# Patient Record
Sex: Female | Born: 1980 | Race: White | Hispanic: No | Marital: Single | Smoking: Former smoker
Health system: Southern US, Community
[De-identification: ages and names within clinical notes are randomized; demographics above are authoritative.]

## PROBLEM LIST (undated history)

## (undated) ENCOUNTER — Inpatient Hospital Stay (HOSPITAL_COMMUNITY): Payer: Self-pay

## (undated) DIAGNOSIS — Z789 Other specified health status: Secondary | ICD-10-CM

## (undated) DIAGNOSIS — F1411 Cocaine abuse, in remission: Secondary | ICD-10-CM

## (undated) HISTORY — PX: NO PAST SURGERIES: SHX2092

---

## 2005-01-09 ENCOUNTER — Emergency Department (HOSPITAL_COMMUNITY): Admission: EM | Admit: 2005-01-09 | Discharge: 2005-01-09 | Payer: Self-pay | Admitting: Emergency Medicine

## 2008-05-06 ENCOUNTER — Emergency Department (HOSPITAL_BASED_OUTPATIENT_CLINIC_OR_DEPARTMENT_OTHER): Admission: EM | Admit: 2008-05-06 | Discharge: 2008-05-06 | Payer: Self-pay | Admitting: Emergency Medicine

## 2008-06-05 ENCOUNTER — Emergency Department (HOSPITAL_BASED_OUTPATIENT_CLINIC_OR_DEPARTMENT_OTHER): Admission: EM | Admit: 2008-06-05 | Discharge: 2008-06-05 | Payer: Self-pay | Admitting: Emergency Medicine

## 2011-03-06 LAB — ANTIBODY SCREEN: Antibody Screen: NEGATIVE

## 2011-03-06 LAB — CBC: Hemoglobin: 13.2 g/dL (ref 12.0–16.0)

## 2011-03-06 LAB — RPR: RPR: NONREACTIVE

## 2011-03-06 LAB — HIV ANTIBODY (ROUTINE TESTING W REFLEX): HIV: NONREACTIVE

## 2011-03-12 LAB — CYTOLOGY - PAP: Pap: NEGATIVE

## 2011-03-12 LAB — GC/CHLAMYDIA PROBE AMP, GENITAL
Chlamydia: NEGATIVE
Gonorrhea: NEGATIVE

## 2011-04-10 LAB — URINE MICROSCOPIC-ADD ON

## 2011-04-10 LAB — URINE CULTURE

## 2011-04-10 LAB — URINALYSIS, ROUTINE W REFLEX MICROSCOPIC
Hgb urine dipstick: NEGATIVE
Ketones, ur: NEGATIVE mg/dL
Protein, ur: NEGATIVE mg/dL
Urobilinogen, UA: 1 mg/dL (ref 0.0–1.0)
pH: 7 (ref 5.0–8.0)

## 2011-05-16 LAB — AFP, QUAD SCREEN: Maternal quad screen for MFM: NEGATIVE

## 2011-07-07 NOTE — L&D Delivery Note (Signed)
Agree with above note.  Joshalyn Ancheta 11/09/2011 2:24 PM   

## 2011-07-07 NOTE — L&D Delivery Note (Signed)
Delivery Note At 10:24 PM a viable female was delivered via Vaginal, Spontaneous Delivery (Presentation: Middle Occiput Anterior).  APGAR: 8, 9; weight .   Placenta status: Intact, Spontaneous.  Cord: 3 vessels with the following complications: None.  Cord pH: NA  Nuchal cord x1. Easily reducible  Anesthesia: Epidural  Episiotomy: None Lacerations: 1st Degree; perineal Suture Repair: 3.0 vicryl Est. Blood Loss (mL):   Mom to postpartum.  Baby to nursery-stable.  Abbye Lao 10/20/2011, 10:57 PM

## 2011-08-10 LAB — CBC: Hemoglobin: 11.4 g/dL — AB (ref 12.0–16.0)

## 2011-08-10 LAB — GLUCOSE, 1 HOUR: Drug Screen, Urine: NEGATIVE

## 2011-09-15 DIAGNOSIS — O99320 Drug use complicating pregnancy, unspecified trimester: Secondary | ICD-10-CM | POA: Insufficient documentation

## 2011-09-15 LAB — DRUG SCREEN, URINE

## 2011-09-17 ENCOUNTER — Ambulatory Visit (INDEPENDENT_AMBULATORY_CARE_PROVIDER_SITE_OTHER): Payer: Medicaid Other | Admitting: Obstetrics & Gynecology

## 2011-09-17 ENCOUNTER — Encounter: Payer: Self-pay | Admitting: Obstetrics & Gynecology

## 2011-09-17 VITALS — BP 111/71 | Temp 98.5°F | Wt 189.9 lb

## 2011-09-17 DIAGNOSIS — F1910 Other psychoactive substance abuse, uncomplicated: Secondary | ICD-10-CM

## 2011-09-17 DIAGNOSIS — F1920 Other psychoactive substance dependence, uncomplicated: Secondary | ICD-10-CM

## 2011-09-17 DIAGNOSIS — O0973 Supervision of high risk pregnancy due to social problems, third trimester: Secondary | ICD-10-CM

## 2011-09-17 DIAGNOSIS — O99340 Other mental disorders complicating pregnancy, unspecified trimester: Secondary | ICD-10-CM

## 2011-09-17 DIAGNOSIS — O99320 Drug use complicating pregnancy, unspecified trimester: Secondary | ICD-10-CM

## 2011-09-17 LAB — POCT URINALYSIS DIP (DEVICE)
Glucose, UA: NEGATIVE mg/dL
Hgb urine dipstick: NEGATIVE
Ketones, ur: NEGATIVE mg/dL
Leukocytes, UA: NEGATIVE
Protein, ur: NEGATIVE mg/dL
pH: 7 (ref 5.0–8.0)

## 2011-09-17 MED ORDER — RANITIDINE HCL 150 MG PO TABS: 150.0000 mg | ORAL_TABLET | Freq: Two times a day (BID) | ORAL | Status: DC

## 2011-09-17 NOTE — Patient Instructions (Signed)
Breastfeeding BENEFITS OF BREASTFEEDING For the baby  The first milk (colostrum) helps the baby's digestive system function better.   There are antibodies from the mother in the milk that help the baby fight off infections.   The baby has a lower incidence of asthma, allergies, and SIDS (sudden infant death syndrome).   The nutrients in breast milk are better than formulas for the baby and helps the baby's brain grow better.   Babies who breastfeed have less gas, colic, and constipation.  For the mother  Breastfeeding helps develop a very special bond between mother and baby.   It is more convenient, always available at the correct temperature and cheaper than formula feeding.   It burns calories in the mother and helps with losing weight that was gained during pregnancy.   It makes the uterus contract back down to normal size faster and slows bleeding following delivery.   Breastfeeding mothers have a lower risk of developing breast cancer.  NURSE FREQUENTLY  A healthy, full-term baby may breastfeed as often as every hour or space his or her feedings to every 3 hours.   How often to nurse will vary from baby to baby. Watch your baby for signs of hunger, not the clock.   Nurse as often as the baby requests, or when you feel the need to reduce the fullness of your breasts.   Awaken the baby if it has been 3 to 4 hours since the last feeding.   Frequent feeding will help the mother make more milk and will prevent problems like sore nipples and engorgement of the breasts.  BABY'S POSITION AT THE BREAST  Whether lying down or sitting, be sure that the baby's tummy is facing your tummy.   Support the breast with 4 fingers underneath the breast and the thumb above. Make sure your fingers are well away from the nipple and baby's mouth.   Stroke the baby's lips and cheek closest to the breast gently with your finger or nipple.   When the baby's mouth is open wide enough, place all  of your nipple and as much of the dark area around the nipple as possible into your baby's mouth.   Pull the baby in close so the tip of the nose and the baby's cheeks touch the breast during the feeding.  FEEDINGS  The length of each feeding varies from baby to baby and from feeding to feeding.   The baby must suck about 2 to 3 minutes for your milk to get to him or her. This is called a "let down." For this reason, allow the baby to feed on each breast as long as he or she wants. Your baby will end the feeding when he or she has received the right balance of nutrients.   To break the suction, put your finger into the corner of the baby's mouth and slide it between his or her gums before removing your breast from his or her mouth. This will help prevent sore nipples.  REDUCING BREAST ENGORGEMENT  In the first week after your baby is born, you may experience signs of breast engorgement. When breasts are engorged, they feel heavy, warm, full, and may be tender to the touch. You can reduce engorgement if you:   Nurse frequently, every 2 to 3 hours. Mothers who breastfeed early and often have fewer problems with engorgement.   Place light ice packs on your breasts between feedings. This reduces swelling. Wrap the ice packs in a   lightweight towel to protect your skin.   Apply moist hot packs to your breast for 5 to 10 minutes before each feeding. This increases circulation and helps the milk flow.   Gently massage your breast before and during the feeding.   Make sure that the baby empties at least one breast at every feeding before switching sides.   Use a breast pump to empty the breasts if your baby is sleepy or not nursing well. You may also want to pump if you are returning to work or or you feel you are getting engorged.   Avoid bottle feeds, pacifiers or supplemental feedings of water or juice in place of breastfeeding.   Be sure the baby is latched on and positioned properly while  breastfeeding.   Prevent fatigue, stress, and anemia.   Wear a supportive bra, avoiding underwire styles.   Eat a balanced diet with enough fluids.  If you follow these suggestions, your engorgement should improve in 24 to 48 hours. If you are still experiencing difficulty, call your lactation consultant or caregiver. IS MY BABY GETTING ENOUGH MILK? Sometimes, mothers worry about whether their babies are getting enough milk. You can be assured that your baby is getting enough milk if:  The baby is actively sucking and you hear swallowing.   The baby nurses at least 8 to 12 times in a 24 hour time period. Nurse your baby until he or she unlatches or falls asleep at the first breast (at least 10 to 20 minutes), then offer the second side.   The baby is wetting 5 to 6 disposable diapers (6 to 8 cloth diapers) in a 24 hour period by 5 to 6 days of age.   The baby is having at least 2 to 3 stools every 24 hours for the first few months. Breast milk is all the food your baby needs. It is not necessary for your baby to have water or formula. In fact, to help your breasts make more milk, it is best not to give your baby supplemental feedings during the early weeks.   The stool should be soft and yellow.   The baby should gain 4 to 7 ounces per week after he is 4 days old.  TAKE CARE OF YOURSELF Take care of your breasts by:  Bathing or showering daily.   Avoiding the use of soaps on your nipples.   Start feedings on your left breast at one feeding and on your right breast at the next feeding.   You will notice an increase in your milk supply 2 to 5 days after delivery. You may feel some discomfort from engorgement, which makes your breasts very firm and often tender. Engorgement "peaks" out within 24 to 48 hours. In the meantime, apply warm moist towels to your breasts for 5 to 10 minutes before feeding. Gentle massage and expression of some milk before feeding will soften your breasts, making  it easier for your baby to latch on. Wear a well fitting nursing bra and air dry your nipples for 10 to 15 minutes after each feeding.   Only use cotton bra pads.   Only use pure lanolin on your nipples after nursing. You do not need to wash it off before nursing.  Take care of yourself by:   Eating well-balanced meals and nutritious snacks.   Drinking milk, fruit juice, and water to satisfy your thirst (about 8 glasses a day).   Getting plenty of rest.   Increasing calcium in   your diet (1200 mg a day).   Avoiding foods that you notice affect the baby in a bad way.  SEEK MEDICAL CARE IF:   You have any questions or difficulty with breastfeeding.   You need help.   You have a hard, red, sore area on your breast, accompanied by a fever of 100.5 F (38.1 C) or more.   Your baby is too sleepy to eat well or is having trouble sleeping.   Your baby is wetting less than 6 diapers per day, by 5 days of age.   Your baby's skin or white part of his or her eyes is more yellow than it was in the hospital.   You feel depressed.  Document Released: 06/22/2005 Document Revised: 06/11/2011 Document Reviewed: 02/04/2009 [employer] Patient Information 2012 [employer]. 

## 2011-09-17 NOTE — Progress Notes (Signed)
U/S 09/18/11 at 1030 am.

## 2011-09-17 NOTE — Progress Notes (Signed)
Pulse 89. Pain upper abdomen, and vagina. Vaginal d/c as thin, and brownish.

## 2011-09-17 NOTE — Progress Notes (Signed)
Nutrition Note:  (1st nutrition consult) Pt seen at [facility] for substance abuse.  Pt does receive WIC services. Pt reports good intake of 6 small meals, severe nausea and vomiting in first 7 months.  Does have heartburn currently. Overall wt gain of 30.9# is excessive at [redacted]w[redacted]d gestation.  Plots 10#> expected. Disc wt gain goals and increased activity.  Patient takes PNV and Zantac.  Reports excessive soda intake and limited water.  Pt is a smoker but plans to stop smoking today.  Follow up if referred.  Cy Blamer, RD

## 2011-09-18 ENCOUNTER — Ambulatory Visit (HOSPITAL_COMMUNITY)
Admission: RE | Admit: 2011-09-18 | Discharge: 2011-09-18 | Disposition: A | Discharge status: Home / Self Care | Payer: Medicaid Other | Source: Ambulatory Visit | Attending: Obstetrics & Gynecology | Admitting: Obstetrics & Gynecology

## 2011-09-18 ENCOUNTER — Encounter: Payer: Self-pay | Admitting: *Deleted

## 2011-09-18 DIAGNOSIS — O99320 Drug use complicating pregnancy, unspecified trimester: Secondary | ICD-10-CM

## 2011-09-18 DIAGNOSIS — F1920 Other psychoactive substance dependence, uncomplicated: Secondary | ICD-10-CM | POA: Insufficient documentation

## 2011-09-18 DIAGNOSIS — F1410 Cocaine abuse, uncomplicated: Secondary | ICD-10-CM | POA: Insufficient documentation

## 2011-09-18 DIAGNOSIS — O0973 Supervision of high risk pregnancy due to social problems, third trimester: Secondary | ICD-10-CM

## 2011-09-18 DIAGNOSIS — O3660X0 Maternal care for excessive fetal growth, unspecified trimester, not applicable or unspecified: Secondary | ICD-10-CM | POA: Insufficient documentation

## 2011-09-18 DIAGNOSIS — O99330 Smoking (tobacco) complicating pregnancy, unspecified trimester: Secondary | ICD-10-CM | POA: Insufficient documentation

## 2011-09-21 ENCOUNTER — Encounter: Payer: Self-pay | Admitting: Obstetrics & Gynecology

## 2011-09-21 DIAGNOSIS — O3660X0 Maternal care for excessive fetal growth, unspecified trimester, not applicable or unspecified: Secondary | ICD-10-CM | POA: Insufficient documentation

## 2011-09-28 ENCOUNTER — Inpatient Hospital Stay (HOSPITAL_COMMUNITY)
Admission: AD | Admit: 2011-09-28 | Discharge: 2011-09-28 | Disposition: A | Discharge status: Home / Self Care | Payer: Medicaid Other | Source: Ambulatory Visit | Attending: Obstetrics & Gynecology | Admitting: Obstetrics & Gynecology

## 2011-09-28 ENCOUNTER — Encounter (HOSPITAL_COMMUNITY): Payer: Self-pay | Admitting: *Deleted

## 2011-09-28 DIAGNOSIS — O212 Late vomiting of pregnancy: Secondary | ICD-10-CM | POA: Insufficient documentation

## 2011-09-28 DIAGNOSIS — R109 Unspecified abdominal pain: Secondary | ICD-10-CM | POA: Insufficient documentation

## 2011-09-28 DIAGNOSIS — O210 Mild hyperemesis gravidarum: Secondary | ICD-10-CM

## 2011-09-28 DIAGNOSIS — D689 Coagulation defect, unspecified: Secondary | ICD-10-CM | POA: Insufficient documentation

## 2011-09-28 DIAGNOSIS — O99119 Other diseases of the blood and blood-forming organs and certain disorders involving the immune mechanism complicating pregnancy, unspecified trimester: Secondary | ICD-10-CM | POA: Insufficient documentation

## 2011-09-28 DIAGNOSIS — D696 Thrombocytopenia, unspecified: Secondary | ICD-10-CM

## 2011-09-28 HISTORY — DX: Other specified health status: Z78.9

## 2011-09-28 LAB — CBC
Platelets: 107 10*3/uL — ABNORMAL LOW (ref 150–400)
RBC: 3.33 MIL/uL — ABNORMAL LOW (ref 3.87–5.11)
RDW: 13.2 % (ref 11.5–15.5)
WBC: 8.4 10*3/uL (ref 4.0–10.5)

## 2011-09-28 LAB — URINALYSIS, ROUTINE W REFLEX MICROSCOPIC
Bilirubin Urine: NEGATIVE
Ketones, ur: NEGATIVE mg/dL
Leukocytes, UA: NEGATIVE
Protein, ur: NEGATIVE mg/dL

## 2011-09-28 LAB — COMPREHENSIVE METABOLIC PANEL
ALT: 8 U/L (ref 0–35)
Albumin: 2.3 g/dL — ABNORMAL LOW (ref 3.5–5.2)
BUN: 8 mg/dL (ref 6–23)
Creatinine, Ser: 0.65 mg/dL (ref 0.50–1.10)
Glucose, Bld: 90 mg/dL (ref 70–99)

## 2011-09-28 LAB — RAPID URINE DRUG SCREEN, HOSP PERFORMED
Amphetamines: POSITIVE — AB
Benzodiazepines: NOT DETECTED
Cocaine: NOT DETECTED
Opiates: NOT DETECTED
Tetrahydrocannabinol: NOT DETECTED

## 2011-09-28 LAB — WET PREP, GENITAL
Clue Cells Wet Prep HPF POC: NONE SEEN
Trich, Wet Prep: NONE SEEN
WBC, Wet Prep HPF POC: NONE SEEN
Yeast Wet Prep HPF POC: NONE SEEN

## 2011-09-28 MED ORDER — ONDANSETRON 8 MG PO TBDP
8.0000 mg | ORAL_TABLET | ORAL | Status: AC
  Administered 2011-09-28: 8 mg via ORAL
  Filled 2011-09-28: qty 1

## 2011-09-28 MED ORDER — ONDANSETRON HCL 8 MG PO TABS: 4.0000 mg | ORAL_TABLET | Freq: Three times a day (TID) | ORAL | Status: AC | PRN

## 2011-09-28 NOTE — MAU Provider Note (Signed)
History     CSN: [id number]  Arrival date and time: 09/28/11 1027   None    Chief Complaint  Patient presents with  . Emesis During Pregnancy  . Abdominal Pain   HPI  [name] [name] is a 31 y.o. G2P1001 who presents at [redacted]w[redacted]d with nausea and vomiting since yesterday. Emesis x7, with three today. Intermittent sharp lower abdominal pain when vomiting. Urge to vomit increases when around light or sound. Able to keep down water, but nothing else. No bleeding or gush of fluid. Mild amount of brownish-yellow discharge for 1 week. Receives prenatal care at the [facility] for substance abuse. Only seen once. Incarcerated from 02/02/11 through 08/03/11. Relapse on 2/27 with crack cocaine, marijuana and xanax use which showed up on her last UDS on 09/07/11. Denies any drug use since this time. First delivery was SVD, weighing 7lbs, 6ozs. Last ultrasound shows weight >90% consistent with LGA. 1 hr glucose was 104 at the health department. Estimated Date of Delivery: 10/28/11 based on  U/S at 19 weeks. EDD 10/23/11 based on LMP.   Past Medical History  Diagnosis Date  . No pertinent past medical history     Past Surgical History  Procedure Date  . No past surgeries     Family History  Problem Relation Age of Onset  . Anesthesia problems Neg Hx     History  Substance Use Topics  . Smoking status: Current Some Day Smoker -- 13 years    Types: Cigarettes  . Smokeless tobacco: Former Neurosurgeon    Quit date: 09/17/2011  . Alcohol Use: Not on file    Allergies:  Allergies  Allergen Reactions  . Risperdal     Prescriptions prior to admission  Medication Sig Dispense Refill  . acetaminophen (TYLENOL) 325 MG tablet Take 325 mg by mouth every 6 (six) hours as needed.      Marland Kitchen PRENATAL VITAMINS PO Take 1 tablet by mouth daily.      . ranitidine (ZANTAC) 150 MG tablet Take 1 tablet (150 mg total) by mouth 2 (two) times daily.  60 tablet  1    Review of Systems  Constitutional: Negative for  fever and diaphoresis.  Eyes: Negative for blurred vision, double vision and photophobia.  Respiratory: Negative for shortness of breath.   Gastrointestinal: Positive for nausea, vomiting and abdominal pain. Negative for diarrhea.  Genitourinary: Negative for hematuria.  Neurological: Negative for headaches.   Physical Exam   Height 5' 6.5" (1.689 m), weight 88.225 kg (194 lb 8 oz), last menstrual period 01/16/2011.  Physical Exam  Constitutional: She is oriented to person, place, and time. She appears well-developed and well-nourished. No distress.  HENT:  Head: Normocephalic and atraumatic.  Eyes: EOM are normal.  Neck: Normal range of motion.  Cardiovascular: Normal rate.   Respiratory: Effort normal. No respiratory distress.  GI: Soft. She exhibits distension (gravid).  Musculoskeletal: Normal range of motion.  Neurological: She is alert and oriented to person, place, and time. No cranial nerve deficit.  Skin: Skin is warm and dry. No rash noted. She is not diaphoretic. No erythema.  Psychiatric: She has a normal mood and affect. Her behavior is normal. Judgment and thought content normal.    MAU Course  Procedures  FHR: 135 baseline, + accels, no decels, moderate variability. Categorgy I No contractions  Results for orders placed during the hospital encounter of 09/28/11 (from the past 24 hour(s))  URINALYSIS, ROUTINE W REFLEX MICROSCOPIC     Status:  Normal   Collection Time   09/28/11 11:09 AM      Component Value Range   Color, Urine YELLOW  YELLOW    APPearance CLEAR  CLEAR    Specific Gravity, Urine 1.020  1.005 - 1.030    pH 7.0  5.0 - 8.0    Glucose, UA NEGATIVE  NEGATIVE (mg/dL)   Hgb urine dipstick NEGATIVE  NEGATIVE    Bilirubin Urine NEGATIVE  NEGATIVE    Ketones, ur NEGATIVE  NEGATIVE (mg/dL)   Protein, ur NEGATIVE  NEGATIVE (mg/dL)   Urobilinogen, UA 0.2  0.0 - 1.0 (mg/dL)   Nitrite NEGATIVE  NEGATIVE    Leukocytes, UA NEGATIVE  NEGATIVE     COMPREHENSIVE METABOLIC PANEL     Status: Abnormal   Collection Time   09/28/11 11:37 AM      Component Value Range   Sodium 138  135 - 145 (mEq/L)   Potassium 3.5  3.5 - 5.1 (mEq/L)   Chloride 106  96 - 112 (mEq/L)   CO2 23  19 - 32 (mEq/L)   Glucose, Bld 90  70 - 99 (mg/dL)   BUN 8  6 - 23 (mg/dL)   Creatinine, Ser 4.09  0.50 - 1.10 (mg/dL)   Calcium 8.7  8.4 - 81.1 (mg/dL)   Total Protein 5.3 (*) 6.0 - 8.3 (g/dL)   Albumin 2.3 (*) 3.5 - 5.2 (g/dL)   AST 12  0 - 37 (U/L)   ALT 8  0 - 35 (U/L)   Alkaline Phosphatase 95  39 - 117 (U/L)   Total Bilirubin 0.2 (*) 0.3 - 1.2 (mg/dL)   GFR calc non Af Amer >90  >90 (mL/min)   GFR calc Af Amer >90  >90 (mL/min)  CBC     Status: Abnormal   Collection Time   09/28/11 11:37 AM      Component Value Range   WBC 8.4  4.0 - 10.5 (K/uL)   RBC 3.33 (*) 3.87 - 5.11 (MIL/uL)   Hemoglobin 10.7 (*) 12.0 - 15.0 (g/dL)   HCT 91.4 (*) 78.2 - 46.0 (%)   MCV 95.2  78.0 - 100.0 (fL)   MCH 32.1  26.0 - 34.0 (pg)   MCHC 33.8  30.0 - 36.0 (g/dL)   RDW 95.6  21.3 - 08.6 (%)   Platelets 107 (*) 150 - 400 (K/uL)  URINE RAPID DRUG SCREEN (HOSP PERFORMED)     Status: Abnormal   Collection Time   09/28/11 11:50 AM      Component Value Range   Opiates NONE DETECTED  NONE DETECTED    Cocaine NONE DETECTED  NONE DETECTED    Benzodiazepines NONE DETECTED  NONE DETECTED    Amphetamines POSITIVE (*) NONE DETECTED    Tetrahydrocannabinol NONE DETECTED  NONE DETECTED    Barbiturates NONE DETECTED  NONE DETECTED   WET PREP, GENITAL     Status: Normal   Collection Time   09/28/11  1:30 PM      Component Value Range   Yeast Wet Prep HPF POC NONE SEEN  NONE SEEN    Trich, Wet Prep NONE SEEN  NONE SEEN    Clue Cells Wet Prep HPF POC NONE SEEN  NONE SEEN    WBC, Wet Prep HPF POC NONE SEEN  NONE SEEN      Assessment and Plan  31 y.o. G2P1001 who presents at [redacted]w[redacted]d IUP Hyperemesis Gravidarum  - No s/sx of dehydration  - Discharge home  - Rx for  zofran +  ampehtamine on UDS  - patient denied on further questioning  - patient to discuss with [facility] Thrombocytopenia (107)  - no other signs of HELLP  - BP wnl  - will need repeat at next clinic visit Patient to follow up at [facility] this week  Ala Dach 09/28/2011, 10:45 AM

## 2011-09-28 NOTE — MAU Note (Signed)
Pt states N/V started yesterday about 1500.  Pt unable to keep food down but has been able to keep fluids down.  Vomited x 7 in last 24 hours.  This am around 0600, pt c/o lower abd pain and describes as a sharp shooting pain bilaterally.  Pt denies and vaginal bleeding or ROM.

## 2011-09-28 NOTE — Discharge Instructions (Signed)
Hyperemesis Gravidarum  Hyperemesis gravidarum is a severe form of nausea and vomiting that happens during pregnancy. Hyperemesis is worse than morning sickness. It may cause a woman to have nausea or vomiting all day for many days. It may keep a woman from eating and drinking enough food and liquids. Hyperemesis usually occurs during the first half (the first 20 weeks) of pregnancy. It often goes away once a woman is in her second half of pregnancy. However, sometimes hyperemesis continues through an entire pregnancy.   CAUSES   The cause of this condition is not completely known but is thought to be due to changes in the body's hormones when pregnant. It could be the high level of the pregnancy hormone or an increase in estrogen in the body.   SYMPTOMS    Severe nausea and vomiting.   Nausea that does not go away.   Vomiting that does not allow you to keep any food down.   Weight loss and body fluid loss (dehydration).   Having no desire to eat or not liking food you have previously enjoyed.  DIAGNOSIS   Your caregiver may ask you about your symptoms. Your caregiver may also order blood tests and urine tests to make sure something else is not causing the problem.   TREATMENT   You may only need medicine to control the problem. If medicines do not control the nausea and vomiting, you will be treated in the hospital to prevent dehydration, acidosis, weight loss, and changes in the electrolytes in your body that may harm the unborn baby (fetus). You may need intravenous (IV) fluids.   HOME CARE INSTRUCTIONS    Take all medicine as directed by your caregiver.   Try eating a couple of dry crackers or toast in the morning before getting out of bed.   Avoid foods and smells that upset your stomach.   Avoid fatty and spicy foods. Eat 5 to 6 small meals a day.   Do not drink when eating meals. Drink between meals.   For snacks, eat high protein foods, such as cheese. Eat or suck on things that have ginger in  them. Ginger helps nausea.   Avoid food preparation. The smell of food can spoil your appetite.   Avoid iron pills and iron in your multivitamins until after 3 to 4 months of being pregnant.  SEEK MEDICAL CARE IF:    Your abdominal pain increases since the last time you saw your caregiver.   You have a severe headache.   You develop vision problems.   You feel you are losing weight.  SEEK IMMEDIATE MEDICAL CARE IF:    You are unable to keep fluids down.   You vomit blood.   You have constant nausea and vomiting.   You have a fever.   You have excessive weakness, dizziness, fainting, or extreme thirst.  MAKE SURE YOU:    Understand these instructions.   Will watch your condition.   Will get help right away if you are not doing well or get worse.  Document Released: 06/22/2005 Document Revised: 06/11/2011 Document Reviewed: 09/22/2010  [employer] Patient Information 2012 [employer].

## 2011-09-29 NOTE — MAU Provider Note (Signed)
Medical Screening exam and patient care preformed by advanced practice provider.  Agree with the above management.  

## 2011-10-01 ENCOUNTER — Ambulatory Visit (INDEPENDENT_AMBULATORY_CARE_PROVIDER_SITE_OTHER): Payer: Medicaid Other | Admitting: Family Medicine

## 2011-10-01 VITALS — BP 111/74 | Temp 97.5°F | Wt 193.4 lb

## 2011-10-01 DIAGNOSIS — O3660X0 Maternal care for excessive fetal growth, unspecified trimester, not applicable or unspecified: Secondary | ICD-10-CM

## 2011-10-01 DIAGNOSIS — F1910 Other psychoactive substance abuse, uncomplicated: Secondary | ICD-10-CM

## 2011-10-01 DIAGNOSIS — O09899 Supervision of other high risk pregnancies, unspecified trimester: Secondary | ICD-10-CM

## 2011-10-01 DIAGNOSIS — O99320 Drug use complicating pregnancy, unspecified trimester: Secondary | ICD-10-CM

## 2011-10-01 DIAGNOSIS — O99340 Other mental disorders complicating pregnancy, unspecified trimester: Secondary | ICD-10-CM

## 2011-10-01 DIAGNOSIS — D696 Thrombocytopenia, unspecified: Secondary | ICD-10-CM

## 2011-10-01 DIAGNOSIS — O0973 Supervision of high risk pregnancy due to social problems, third trimester: Secondary | ICD-10-CM

## 2011-10-01 LAB — POCT URINALYSIS DIP (DEVICE)
Glucose, UA: NEGATIVE mg/dL
Hgb urine dipstick: NEGATIVE
Protein, ur: NEGATIVE mg/dL
Specific Gravity, Urine: 1.015 (ref 1.005–1.030)
Urobilinogen, UA: 0.2 mg/dL (ref 0.0–1.0)
pH: 8.5 — ABNORMAL HIGH (ref 5.0–8.0)

## 2011-10-01 NOTE — Progress Notes (Signed)
I have seen and examined this patient in conjunction with Dr Fara Boros.  I have taken this history and performed the exam.  I agree with the note as written above and have made corrections as needed.

## 2011-10-01 NOTE — Patient Instructions (Signed)
Pregnancy - Third Trimester The third trimester of pregnancy (the last 3 months) is a period of the most rapid growth for you and your baby. The baby approaches a length of 20 inches and a weight of 6 to 10 pounds. The baby is adding on fat and getting ready for life outside your body. While inside, babies have periods of sleeping and waking, suck their thumbs, and hiccups. You can often feel small contractions of the uterus. This is false labor. It is also called Braxton-Hicks contractions. This is like a practice for labor. The usual problems in this stage of pregnancy include more difficulty breathing, swelling of the hands and feet from water retention, and having to urinate more often because of the uterus and baby pressing on your bladder.  PRENATAL EXAMS  Blood work may continue to be done during prenatal exams. These tests are done to check on your health and the probable health of your baby. Blood work is used to follow your blood levels (hemoglobin). Anemia (low hemoglobin) is common during pregnancy. Iron and vitamins are given to help prevent this. You may also continue to be checked for diabetes. Some of the past blood tests may be done again.   The size of the uterus is measured during each visit. This makes sure your baby is growing properly according to your pregnancy dates.   Your blood pressure is checked every prenatal visit. This is to make sure you are not getting toxemia.   Your urine is checked every prenatal visit for infection, diabetes and protein.   Your weight is checked at each visit. This is done to make sure gains are happening at the suggested rate and that you and your baby are growing normally.   Sometimes, an ultrasound is performed to confirm the position and the proper growth and development of the baby. This is a test done that bounces harmless sound waves off the baby so your caregiver can more accurately determine due dates.   Discuss the type of pain  medication and anesthesia you will have during your labor and delivery.   Discuss the possibility and anesthesia if a Cesarean Section might be necessary.   Inform your caregiver if there is any mental or physical violence at home.  Sometimes, a specialized non-stress test, contraction stress test and biophysical profile are done to make sure the baby is not having a problem. Checking the amniotic fluid surrounding the baby is called an amniocentesis. The amniotic fluid is removed by sticking a needle into the belly (abdomen). This is sometimes done near the end of pregnancy if an early delivery is required. In this case, it is done to help make sure the baby's lungs are mature enough for the baby to live outside of the womb. If the lungs are not mature and it is unsafe to deliver the baby, an injection of cortisone medication is given to the mother 1 to 2 days before the delivery. This helps the baby's lungs mature and makes it safer to deliver the baby. CHANGES OCCURING IN THE THIRD TRIMESTER OF PREGNANCY Your body goes through many changes during pregnancy. They vary from person to person. Talk to your caregiver about changes you notice and are concerned about.  During the last trimester, you have probably had an increase in your appetite. It is normal to have cravings for certain foods. This varies from person to person and pregnancy to pregnancy.   You may begin to get stretch marks on your hips,   abdomen, and breasts. These are normal changes in the body during pregnancy. There are no exercises or medications to take which prevent this change.   Constipation may be treated with a stool softener or adding bulk to your diet. Drinking lots of fluids, fiber in vegetables, fruits, and whole grains are helpful.   Exercising is also helpful. If you have been very active up until your pregnancy, most of these activities can be continued during your pregnancy. If you have been less active, it is helpful  to start an exercise program such as walking. Consult your caregiver before starting exercise programs.   Avoid all smoking, alcohol, un-prescribed drugs, herbs and "street drugs" during your pregnancy. These chemicals affect the formation and growth of the baby. Avoid chemicals throughout the pregnancy to ensure the delivery of a healthy infant.   Backache, varicose veins and hemorrhoids may develop or get worse.   You will tire more easily in the third trimester, which is normal.   The baby's movements may be stronger and more often.   You may become short of breath easily.   Your belly button may stick out.   A yellow discharge may leak from your breasts called colostrum.   You may have a bloody mucus discharge. This usually occurs a few days to a week before labor begins.  HOME CARE INSTRUCTIONS   Keep your caregiver's appointments. Follow your caregiver's instructions regarding medication use, exercise, and diet.   During pregnancy, you are providing food for you and your baby. Continue to eat regular, well-balanced meals. Choose foods such as meat, fish, milk and other low fat dairy products, vegetables, fruits, and whole-grain breads and cereals. Your caregiver will tell you of the ideal weight gain.   A physical sexual relationship may be continued throughout pregnancy if there are no other problems such as early (premature) leaking of amniotic fluid from the membranes, vaginal bleeding, or belly (abdominal) pain.   Exercise regularly if there are no restrictions. Check with your caregiver if you are unsure of the safety of your exercises. Greater weight gain will occur in the last 2 trimesters of pregnancy. Exercising helps:   Control your weight.   Get you in shape for labor and delivery.   You lose weight after you deliver.   Rest a lot with legs elevated, or as needed for leg cramps or low back pain.   Wear a good support or jogging bra for breast tenderness during  pregnancy. This may help if worn during sleep. Pads or tissues may be used in the bra if you are leaking colostrum.   Do not use hot tubs, steam rooms, or saunas.   Wear your seat belt when driving. This protects you and your baby if you are in an accident.   Avoid raw meat, cat litter boxes and soil used by cats. These carry germs that can cause birth defects in the baby.   It is easier to loose urine during pregnancy. Tightening up and strengthening the pelvic muscles will help with this problem. You can practice stopping your urination while you are going to the bathroom. These are the same muscles you need to strengthen. It is also the muscles you would use if you were trying to stop from passing gas. You can practice tightening these muscles up 10 times a set and repeating this about 3 times per day. Once you know what muscles to tighten up, do not perform these exercises during urination. It is more likely   to cause an infection by backing up the urine.   Ask for help if you have financial, counseling or nutritional needs during pregnancy. Your caregiver will be able to offer counseling for these needs as well as refer you for other special needs.   Make a list of emergency phone numbers and have them available.   Plan on getting help from family or friends when you go home from the hospital.   Make a trial run to the hospital.   Take prenatal classes with the father to understand, practice and ask questions about the labor and delivery.   Prepare the baby's room/nursery.   Do not travel out of the city unless it is absolutely necessary and with the advice of your caregiver.   Wear only low or no heal shoes to have better balance and prevent falling.  MEDICATIONS AND DRUG USE IN PREGNANCY  Take prenatal vitamins as directed. The vitamin should contain 1 milligram of folic acid. Keep all vitamins out of reach of children. Only a couple vitamins or tablets containing iron may be fatal  to a baby or young child when ingested.   Avoid use of all medications, including herbs, over-the-counter medications, not prescribed or suggested by your caregiver. Only take over-the-counter or prescription medicines for pain, discomfort, or fever as directed by your caregiver. Do not use aspirin, ibuprofen (Motrin, Advil, Nuprin) or naproxen (Aleve) unless OK'd by your caregiver.   Let your caregiver also know about herbs you may be using.   Alcohol is related to a number of birth defects. This includes fetal alcohol syndrome. All alcohol, in any form, should be avoided completely. Smoking will cause low birth rate and premature babies.   Street/illegal drugs are very harmful to the baby. They are absolutely forbidden. A baby born to an addicted mother will be addicted at birth. The baby will go through the same withdrawal an adult does.  SEEK MEDICAL CARE IF: You have any concerns or worries during your pregnancy. It is better to call with your questions if you feel they cannot wait, rather than worry about them. DECISIONS ABOUT CIRCUMCISION You may or may not know the sex of your baby. If you know your baby is a boy, it may be time to think about circumcision. Circumcision is the removal of the foreskin of the penis. This is the skin that covers the sensitive end of the penis. There is no proven medical need for this. Often this decision is made on what is popular at the time or based upon religious beliefs and social issues. You can discuss these issues with your caregiver or pediatrician. SEEK IMMEDIATE MEDICAL CARE IF:   An unexplained oral temperature above 102 F (38.9 C) develops, or as your caregiver suggests.   You have leaking of fluid from the vagina (birth canal). If leaking membranes are suspected, take your temperature and tell your caregiver of this when you call.   There is vaginal spotting, bleeding or passing clots. Tell your caregiver of the amount and how many pads are  used.   You develop a bad smelling vaginal discharge with a change in the color from clear to white.   You develop vomiting that lasts more than 24 hours.   You develop chills or fever.   You develop shortness of breath.   You develop burning on urination.   You loose more than 2 pounds of weight or gain more than 2 pounds of weight or as suggested by your   caregiver.   You notice sudden swelling of your face, hands, and feet or legs.   You develop belly (abdominal) pain. Round ligament discomfort is a common non-cancerous (benign) cause of abdominal pain in pregnancy. Your caregiver still must evaluate you.   You develop a severe headache that does not go away.   You develop visual problems, blurred or double vision.   If you have not felt your baby move for more than 1 hour. If you think the baby is not moving as much as usual, eat something with sugar in it and lie down on your left side for an hour. The baby should move at least 4 to 5 times per hour. Call right away if your baby moves less than that.   You fall, are in a car accident or any kind of trauma.   There is mental or physical violence at home.  Document Released: 06/16/2001 Document Revised: 06/11/2011 Document Reviewed: 12/19/2008 [employer] Patient Information 2012 [employer]. Contraception Choices Contraception (birth control) is the use of any methods or devices to prevent pregnancy. Below are some methods to help avoid pregnancy. HORMONAL METHODS   Contraceptive implant. This is a thin, plastic tube containing progesterone hormone. It does not contain estrogen hormone. Your caregiver inserts the tube in the inner part of the upper arm. The tube can remain in place for up to 3 years. After 3 years, the implant must be removed. The implant prevents the ovaries from releasing an egg (ovulation), thickens the cervical mucus which prevents sperm from entering the uterus, and thins the lining of the inside of the  uterus.   Progesterone-only injections. These injections are given every 3 months by your caregiver to prevent pregnancy. This synthetic progesterone hormone stops the ovaries from releasing eggs. It also thickens cervical mucus and changes the uterine lining. This makes it harder for sperm to survive in the uterus.   Birth control pills. These pills contain estrogen and progesterone hormone. They work by stopping the egg from forming in the ovary (ovulation). Birth control pills are prescribed by a caregiver.Birth control pills can also be used to treat heavy periods.   Minipill. This type of birth control pill contains only the progesterone hormone. They are taken every day of each month and must be prescribed by your caregiver.   Birth control patch. The patch contains hormones similar to those in birth control pills. It must be changed once a week and is prescribed by a caregiver.   Vaginal ring. The ring contains hormones similar to those in birth control pills. It is left in the vagina for 3 weeks, removed for 1 week, and then a new one is put back in place. The patient must be comfortable inserting and removing the ring from the vagina.A caregiver's prescription is necessary.   Emergency contraception. Emergency contraceptives prevent pregnancy after unprotected sexual intercourse. This pill can be taken right after sex or up to 5 days after unprotected sex. It is most effective the sooner you take the pills after having sexual intercourse. Emergency contraceptive pills are available without a prescription. Check with your pharmacist. Do not use emergency contraception as your only form of birth control.  BARRIER METHODS   Female condom. This is a thin sheath (latex or rubber) that is worn over the penis during sexual intercourse. It can be used with spermicide to increase effectiveness.   Female condom. This is a soft, loose-fitting sheath that is put into the vagina before sexual  intercourse.     Diaphragm. This is a soft, latex, dome-shaped barrier that must be fitted by a caregiver. It is inserted into the vagina, along with a spermicidal jelly. It is inserted before intercourse. The diaphragm should be left in the vagina for 6 to 8 hours after intercourse.   Cervical cap. This is a round, soft, latex or plastic cup that fits over the cervix and must be fitted by a caregiver. The cap can be left in place for up to 48 hours after intercourse.   Sponge. This is a soft, circular piece of polyurethane foam. The sponge has spermicide in it. It is inserted into the vagina after wetting it and before sexual intercourse.   Spermicides. These are chemicals that kill or block sperm from entering the cervix and uterus. They come in the form of creams, jellies, suppositories, foam, or tablets. They do not require a prescription. They are inserted into the vagina with an applicator before having sexual intercourse. The process must be repeated every time you have sexual intercourse.  INTRAUTERINE CONTRACEPTION  Intrauterine device (IUD). This is a T-shaped device that is put in a woman's uterus during a menstrual period to prevent pregnancy. There are 2 types:   Copper IUD. This type of IUD is wrapped in copper wire and is placed inside the uterus. Copper makes the uterus and fallopian tubes produce a fluid that kills sperm. It can stay in place for 10 years.   Hormone IUD. This type of IUD contains the hormone progestin (synthetic progesterone). The hormone thickens the cervical mucus and prevents sperm from entering the uterus, and it also thins the uterine lining to prevent implantation of a fertilized egg. The hormone can weaken or kill the sperm that get into the uterus. It can stay in place for 5 years.  PERMANENT METHODS OF CONTRACEPTION  Female tubal ligation. This is when the woman's fallopian tubes are surgically sealed, tied, or blocked to prevent the egg from traveling to  the uterus.   Female sterilization. This is when the female has the tubes that carry sperm tied off (vasectomy).This blocks sperm from entering the vagina during sexual intercourse. After the procedure, the man can still ejaculate fluid (semen).  NATURAL PLANNING METHODS  Natural family planning. This is not having sexual intercourse or using a barrier method (condom, diaphragm, cervical cap) on days the woman could become pregnant.   Calendar method. This is keeping track of the length of each menstrual cycle and identifying when you are fertile.   Ovulation method. This is avoiding sexual intercourse during ovulation.   Symptothermal method. This is avoiding sexual intercourse during ovulation, using a thermometer and ovulation symptoms.   Post-ovulation method. This is timing sexual intercourse after you have ovulated.  Regardless of which type or method of contraception you choose, it is important that you use condoms to protect against the transmission of sexually transmitted diseases (STDs). Talk with your caregiver about which form of contraception is most appropriate for you. Document Released: 06/22/2005 Document Revised: 06/11/2011 Document Reviewed: 10/29/2010 [employer] Patient Information 2012 [employer]. Breastfeeding BENEFITS OF BREASTFEEDING For the baby  The first milk (colostrum) helps the baby's digestive system function better.   There are antibodies from the mother in the milk that help the baby fight off infections.   The baby has a lower incidence of asthma, allergies, and SIDS (sudden infant death syndrome).   The nutrients in breast milk are better than formulas for the baby and helps the baby's brain grow better.     Babies who breastfeed have less gas, colic, and constipation.  For the mother  Breastfeeding helps develop a very special bond between mother and baby.   It is more convenient, always available at the correct temperature and cheaper than formula  feeding.   It burns calories in the mother and helps with losing weight that was gained during pregnancy.   It makes the uterus contract back down to normal size faster and slows bleeding following delivery.   Breastfeeding mothers have a lower risk of developing breast cancer.  NURSE FREQUENTLY  A healthy, full-term baby may breastfeed as often as every hour or space his or her feedings to every 3 hours.   How often to nurse will vary from baby to baby. Watch your baby for signs of hunger, not the clock.   Nurse as often as the baby requests, or when you feel the need to reduce the fullness of your breasts.   Awaken the baby if it has been 3 to 4 hours since the last feeding.   Frequent feeding will help the mother make more milk and will prevent problems like sore nipples and engorgement of the breasts.  BABY'S POSITION AT THE BREAST  Whether lying down or sitting, be sure that the baby's tummy is facing your tummy.   Support the breast with 4 fingers underneath the breast and the thumb above. Make sure your fingers are well away from the nipple and baby's mouth.   Stroke the baby's lips and cheek closest to the breast gently with your finger or nipple.   When the baby's mouth is open wide enough, place all of your nipple and as much of the dark area around the nipple as possible into your baby's mouth.   Pull the baby in close so the tip of the nose and the baby's cheeks touch the breast during the feeding.  FEEDINGS  The length of each feeding varies from baby to baby and from feeding to feeding.   The baby must suck about 2 to 3 minutes for your milk to get to him or her. This is called a "let down." For this reason, allow the baby to feed on each breast as long as he or she wants. Your baby will end the feeding when he or she has received the right balance of nutrients.   To break the suction, put your finger into the corner of the baby's mouth and slide it between his or her  gums before removing your breast from his or her mouth. This will help prevent sore nipples.  REDUCING BREAST ENGORGEMENT  In the first week after your baby is born, you may experience signs of breast engorgement. When breasts are engorged, they feel heavy, warm, full, and may be tender to the touch. You can reduce engorgement if you:   Nurse frequently, every 2 to 3 hours. Mothers who breastfeed early and often have fewer problems with engorgement.   Place light ice packs on your breasts between feedings. This reduces swelling. Wrap the ice packs in a lightweight towel to protect your skin.   Apply moist hot packs to your breast for 5 to 10 minutes before each feeding. This increases circulation and helps the milk flow.   Gently massage your breast before and during the feeding.   Make sure that the baby empties at least one breast at every feeding before switching sides.   Use a breast pump to empty the breasts if your baby is sleepy or   not nursing well. You may also want to pump if you are returning to work or or you feel you are getting engorged.   Avoid bottle feeds, pacifiers or supplemental feedings of water or juice in place of breastfeeding.   Be sure the baby is latched on and positioned properly while breastfeeding.   Prevent fatigue, stress, and anemia.   Wear a supportive bra, avoiding underwire styles.   Eat a balanced diet with enough fluids.  If you follow these suggestions, your engorgement should improve in 24 to 48 hours. If you are still experiencing difficulty, call your lactation consultant or caregiver. IS MY BABY GETTING ENOUGH MILK? Sometimes, mothers worry about whether their babies are getting enough milk. You can be assured that your baby is getting enough milk if:  The baby is actively sucking and you hear swallowing.   The baby nurses at least 8 to 12 times in a 24 hour time period. Nurse your baby until he or she unlatches or falls asleep at the first  breast (at least 10 to 20 minutes), then offer the second side.   The baby is wetting 5 to 6 disposable diapers (6 to 8 cloth diapers) in a 24 hour period by 5 to 6 days of age.   The baby is having at least 2 to 3 stools every 24 hours for the first few months. Breast milk is all the food your baby needs. It is not necessary for your baby to have water or formula. In fact, to help your breasts make more milk, it is best not to give your baby supplemental feedings during the early weeks.   The stool should be soft and yellow.   The baby should gain 4 to 7 ounces per week after he is 4 days old.  TAKE CARE OF YOURSELF Take care of your breasts by:  Bathing or showering daily.   Avoiding the use of soaps on your nipples.   Start feedings on your left breast at one feeding and on your right breast at the next feeding.   You will notice an increase in your milk supply 2 to 5 days after delivery. You may feel some discomfort from engorgement, which makes your breasts very firm and often tender. Engorgement "peaks" out within 24 to 48 hours. In the meantime, apply warm moist towels to your breasts for 5 to 10 minutes before feeding. Gentle massage and expression of some milk before feeding will soften your breasts, making it easier for your baby to latch on. Wear a well fitting nursing bra and air dry your nipples for 10 to 15 minutes after each feeding.   Only use cotton bra pads.   Only use pure lanolin on your nipples after nursing. You do not need to wash it off before nursing.  Take care of yourself by:   Eating well-balanced meals and nutritious snacks.   Drinking milk, fruit juice, and water to satisfy your thirst (about 8 glasses a day).   Getting plenty of rest.   Increasing calcium in your diet (1200 mg a day).   Avoiding foods that you notice affect the baby in a bad way.  SEEK MEDICAL CARE IF:   You have any questions or difficulty with breastfeeding.   You need help.    You have a hard, red, sore area on your breast, accompanied by a fever of 100.5 F (38.1 C) or more.   Your baby is too sleepy to eat well or is having   trouble sleeping.   Your baby is wetting less than 6 diapers per day, by 5 days of age.   Your baby's skin or white part of his or her eyes is more yellow than it was in the hospital.   You feel depressed.  Document Released: 06/22/2005 Document Revised: 06/11/2011 Document Reviewed: 02/04/2009 [employer] Patient Information 2012 [employer]. 

## 2011-10-01 NOTE — Progress Notes (Signed)
Pulse: 75

## 2011-10-01 NOTE — Progress Notes (Signed)
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 Seen in [facility] on 3/25 for hyperemesis. + amphetamines on UDS at that time. (pt with known substance abuse) N/V better with zofran.  Has only needed 2-3 times since leaving [facility]. Having some pelvic bone pain and cramping.  Patient concerned about if her EDC needs to be changed-- is currently 4/24 based on 19wk U/S with unsure LMP; had U/S 3/15 showing >97th%ile for growth and calculated EDC of 4/2.  D/w Dr. Adrian Blackwater and will leave [facility] at 4/24 based on 2nd vs 3rd trimester U/S.

## 2011-10-08 ENCOUNTER — Ambulatory Visit (INDEPENDENT_AMBULATORY_CARE_PROVIDER_SITE_OTHER): Payer: Medicaid Other | Admitting: Family Medicine

## 2011-10-08 VITALS — BP 111/73 | Temp 96.1°F | Wt 195.6 lb

## 2011-10-08 DIAGNOSIS — O99340 Other mental disorders complicating pregnancy, unspecified trimester: Secondary | ICD-10-CM

## 2011-10-08 DIAGNOSIS — O99320 Drug use complicating pregnancy, unspecified trimester: Secondary | ICD-10-CM

## 2011-10-08 DIAGNOSIS — F1910 Other psychoactive substance abuse, uncomplicated: Secondary | ICD-10-CM

## 2011-10-08 LAB — POCT URINALYSIS DIP (DEVICE)
Glucose, UA: NEGATIVE mg/dL
Hgb urine dipstick: NEGATIVE
Specific Gravity, Urine: 1.015 (ref 1.005–1.030)
Urobilinogen, UA: 0.2 mg/dL (ref 0.0–1.0)
pH: 7 (ref 5.0–8.0)

## 2011-10-08 MED ORDER — OMEPRAZOLE MAGNESIUM 20 MG PO TBEC: 20.0000 mg | DELAYED_RELEASE_TABLET | Freq: Every day | ORAL | Status: DC

## 2011-10-08 NOTE — Patient Instructions (Addendum)
Normal Labor and Delivery Your caregiver must first be sure you are in labor. Signs of labor include:  You may pass what is called "the mucus plug" before labor begins. This is a small amount of blood stained mucus.   Regular uterine contractions.   The time between contractions get closer together.   The discomfort and pain gradually gets more intense.   Pains are mostly located in the back.   Pains get worse when walking.   The cervix (the opening of the uterus becomes thinner (begins to efface) and opens up (dilates).  Once you are in labor and admitted into the hospital or care center, your caregiver will do the following:  A complete physical examination.   Check your vital signs (blood pressure, pulse, temperature and the fetal heart rate).   Do a vaginal examination (using a sterile glove and lubricant) to determine:   The position (presentation) of the baby (head [vertex] or buttock first).   The level (station) of the baby's head in the birth canal.   The effacement and dilatation of the cervix.   You may have your pubic hair shaved and be given an enema depending on your caregiver and the circumstance.   An electronic monitor is usually placed on your abdomen. The monitor follows the length and intensity of the contractions, as well as the baby's heart rate.   Usually, your caregiver will insert an IV in your arm with a bottle of sugar water. This is done as a precaution so that medications can be given to you quickly during labor or delivery.  NORMAL LABOR AND DELIVERY IS DIVIDED UP INTO 3 STAGES: First Stage This is when regular contractions begin and the cervix begins to efface and dilate. This stage can last from 3 to 15 hours. The end of the first stage is when the cervix is 100% effaced and 10 centimeters dilated. Pain medications may be given by   Injection (morphine, demerol, etc.)   Regional anesthesia (spinal, caudal or epidural, anesthetics given in  different locations of the spine). Paracervical pain medication may be given, which is an injection of and anesthetic on each side of the cervix.  A pregnant woman may request to have "Natural Childbirth" which is not to have any medications or anesthesia during her labor and delivery. Second Stage This is when the baby comes down through the birth canal (vagina) and is born. This can take 1 to 4 hours. As the baby's head comes down through the birth canal, you may feel like you are going to have a bowel movement. You will get the urge to bear down and push until the baby is delivered. As the baby's head is being delivered, the caregiver will decide if an episiotomy (a cut in the perineum and vagina area) is needed to prevent tearing of the tissue in this area. The episiotomy is sewn up after the delivery of the baby and placenta. Sometimes a mask with nitrous oxide is given for the mother to breath during the delivery of the baby to help if there is too much pain. The end of Stage 2 is when the baby is fully delivered. Then when the umbilical cord stops pulsating it is clamped and cut. Third Stage The third stage begins after the baby is completely delivered and ends after the placenta (afterbirth) is delivered. This usually takes 5 to 30 minutes. After the placenta is delivered, a medication is given either by intravenous or injection to help contract   the uterus and prevent bleeding. The third stage is not painful and pain medication is usually not necessary. If an episiotomy was done, it is repaired at this time. After the delivery, the mother is watched and monitored closely for 1 to 2 hours to make sure there is no postpartum bleeding (hemorrhage). If there is a lot of bleeding, medication is given to contract the uterus and stop the bleeding. Document Released: 03/31/2008 Document Revised: 06/11/2011 Document Reviewed: 03/31/2008 [employer] Patient Information 2012 [employer]. Contraception  Choices Contraception (birth control) is the use of any methods or devices to prevent pregnancy. Below are some methods to help avoid pregnancy. HORMONAL METHODS   Contraceptive implant. This is a thin, plastic tube containing progesterone hormone. It does not contain estrogen hormone. Your caregiver inserts the tube in the inner part of the upper arm. The tube can remain in place for up to 3 years. After 3 years, the implant must be removed. The implant prevents the ovaries from releasing an egg (ovulation), thickens the cervical mucus which prevents sperm from entering the uterus, and thins the lining of the inside of the uterus.   Progesterone-only injections. These injections are given every 3 months by your caregiver to prevent pregnancy. This synthetic progesterone hormone stops the ovaries from releasing eggs. It also thickens cervical mucus and changes the uterine lining. This makes it harder for sperm to survive in the uterus.   Birth control pills. These pills contain estrogen and progesterone hormone. They work by stopping the egg from forming in the ovary (ovulation). Birth control pills are prescribed by a caregiver.Birth control pills can also be used to treat heavy periods.   Minipill. This type of birth control pill contains only the progesterone hormone. They are taken every day of each month and must be prescribed by your caregiver.   Birth control patch. The patch contains hormones similar to those in birth control pills. It must be changed once a week and is prescribed by a caregiver.   Vaginal ring. The ring contains hormones similar to those in birth control pills. It is left in the vagina for 3 weeks, removed for 1 week, and then a new one is put back in place. The patient must be comfortable inserting and removing the ring from the vagina.A caregiver's prescription is necessary.   Emergency contraception. Emergency contraceptives prevent pregnancy after unprotected sexual  intercourse. This pill can be taken right after sex or up to 5 days after unprotected sex. It is most effective the sooner you take the pills after having sexual intercourse. Emergency contraceptive pills are available without a prescription. Check with your pharmacist. Do not use emergency contraception as your only form of birth control.  BARRIER METHODS   Female condom. This is a thin sheath (latex or rubber) that is worn over the penis during sexual intercourse. It can be used with spermicide to increase effectiveness.   Female condom. This is a soft, loose-fitting sheath that is put into the vagina before sexual intercourse.   Diaphragm. This is a soft, latex, dome-shaped barrier that must be fitted by a caregiver. It is inserted into the vagina, along with a spermicidal jelly. It is inserted before intercourse. The diaphragm should be left in the vagina for 6 to 8 hours after intercourse.   Cervical cap. This is a round, soft, latex or plastic cup that fits over the cervix and must be fitted by a caregiver. The cap can be left in place for up to   48 hours after intercourse.   Sponge. This is a soft, circular piece of polyurethane foam. The sponge has spermicide in it. It is inserted into the vagina after wetting it and before sexual intercourse.   Spermicides. These are chemicals that kill or block sperm from entering the cervix and uterus. They come in the form of creams, jellies, suppositories, foam, or tablets. They do not require a prescription. They are inserted into the vagina with an applicator before having sexual intercourse. The process must be repeated every time you have sexual intercourse.  INTRAUTERINE CONTRACEPTION  Intrauterine device (IUD). This is a T-shaped device that is put in a woman's uterus during a menstrual period to prevent pregnancy. There are 2 types:   Copper IUD. This type of IUD is wrapped in copper wire and is placed inside the uterus. Copper makes the uterus and  fallopian tubes produce a fluid that kills sperm. It can stay in place for 10 years.   Hormone IUD. This type of IUD contains the hormone progestin (synthetic progesterone). The hormone thickens the cervical mucus and prevents sperm from entering the uterus, and it also thins the uterine lining to prevent implantation of a fertilized egg. The hormone can weaken or kill the sperm that get into the uterus. It can stay in place for 5 years.  PERMANENT METHODS OF CONTRACEPTION  Female tubal ligation. This is when the woman's fallopian tubes are surgically sealed, tied, or blocked to prevent the egg from traveling to the uterus.   Female sterilization. This is when the female has the tubes that carry sperm tied off (vasectomy).This blocks sperm from entering the vagina during sexual intercourse. After the procedure, the man can still ejaculate fluid (semen).  NATURAL PLANNING METHODS  Natural family planning. This is not having sexual intercourse or using a barrier method (condom, diaphragm, cervical cap) on days the woman could become pregnant.   Calendar method. This is keeping track of the length of each menstrual cycle and identifying when you are fertile.   Ovulation method. This is avoiding sexual intercourse during ovulation.   Symptothermal method. This is avoiding sexual intercourse during ovulation, using a thermometer and ovulation symptoms.   Post-ovulation method. This is timing sexual intercourse after you have ovulated.  Regardless of which type or method of contraception you choose, it is important that you use condoms to protect against the transmission of sexually transmitted diseases (STDs). Talk with your caregiver about which form of contraception is most appropriate for you. Document Released: 06/22/2005 Document Revised: 06/11/2011 Document Reviewed: 10/29/2010 [employer] Patient Information 2012 [employer]. Breastfeeding BENEFITS OF BREASTFEEDING For the baby  The first  milk (colostrum) helps the baby's digestive system function better.   There are antibodies from the mother in the milk that help the baby fight off infections.   The baby has a lower incidence of asthma, allergies, and SIDS (sudden infant death syndrome).   The nutrients in breast milk are better than formulas for the baby and helps the baby's brain grow better.   Babies who breastfeed have less gas, colic, and constipation.  For the mother  Breastfeeding helps develop a very special bond between mother and baby.   It is more convenient, always available at the correct temperature and cheaper than formula feeding.   It burns calories in the mother and helps with losing weight that was gained during pregnancy.   It makes the uterus contract back down to normal size faster and slows bleeding following delivery.     Breastfeeding mothers have a lower risk of developing breast cancer.  NURSE FREQUENTLY  A healthy, full-term baby may breastfeed as often as every hour or space his or her feedings to every 3 hours.   How often to nurse will vary from baby to baby. Watch your baby for signs of hunger, not the clock.   Nurse as often as the baby requests, or when you feel the need to reduce the fullness of your breasts.   Awaken the baby if it has been 3 to 4 hours since the last feeding.   Frequent feeding will help the mother make more milk and will prevent problems like sore nipples and engorgement of the breasts.  BABY'S POSITION AT THE BREAST  Whether lying down or sitting, be sure that the baby's tummy is facing your tummy.   Support the breast with 4 fingers underneath the breast and the thumb above. Make sure your fingers are well away from the nipple and baby's mouth.   Stroke the baby's lips and cheek closest to the breast gently with your finger or nipple.   When the baby's mouth is open wide enough, place all of your nipple and as much of the dark area around the nipple as  possible into your baby's mouth.   Pull the baby in close so the tip of the nose and the baby's cheeks touch the breast during the feeding.  FEEDINGS  The length of each feeding varies from baby to baby and from feeding to feeding.   The baby must suck about 2 to 3 minutes for your milk to get to him or her. This is called a "let down." For this reason, allow the baby to feed on each breast as long as he or she wants. Your baby will end the feeding when he or she has received the right balance of nutrients.   To break the suction, put your finger into the corner of the baby's mouth and slide it between his or her gums before removing your breast from his or her mouth. This will help prevent sore nipples.  REDUCING BREAST ENGORGEMENT  In the first week after your baby is born, you may experience signs of breast engorgement. When breasts are engorged, they feel heavy, warm, full, and may be tender to the touch. You can reduce engorgement if you:   Nurse frequently, every 2 to 3 hours. Mothers who breastfeed early and often have fewer problems with engorgement.   Place light ice packs on your breasts between feedings. This reduces swelling. Wrap the ice packs in a lightweight towel to protect your skin.   Apply moist hot packs to your breast for 5 to 10 minutes before each feeding. This increases circulation and helps the milk flow.   Gently massage your breast before and during the feeding.   Make sure that the baby empties at least one breast at every feeding before switching sides.   Use a breast pump to empty the breasts if your baby is sleepy or not nursing well. You may also want to pump if you are returning to work or or you feel you are getting engorged.   Avoid bottle feeds, pacifiers or supplemental feedings of water or juice in place of breastfeeding.   Be sure the baby is latched on and positioned properly while breastfeeding.   Prevent fatigue, stress, and anemia.   Wear a  supportive bra, avoiding underwire styles.   Eat a balanced diet with enough fluids.    If you follow these suggestions, your engorgement should improve in 24 to 48 hours. If you are still experiencing difficulty, call your lactation consultant or caregiver. IS MY BABY GETTING ENOUGH MILK? Sometimes, mothers worry about whether their babies are getting enough milk. You can be assured that your baby is getting enough milk if:  The baby is actively sucking and you hear swallowing.   The baby nurses at least 8 to 12 times in a 24 hour time period. Nurse your baby until he or she unlatches or falls asleep at the first breast (at least 10 to 20 minutes), then offer the second side.   The baby is wetting 5 to 6 disposable diapers (6 to 8 cloth diapers) in a 24 hour period by 5 to 6 days of age.   The baby is having at least 2 to 3 stools every 24 hours for the first few months. Breast milk is all the food your baby needs. It is not necessary for your baby to have water or formula. In fact, to help your breasts make more milk, it is best not to give your baby supplemental feedings during the early weeks.   The stool should be soft and yellow.   The baby should gain 4 to 7 ounces per week after he is 4 days old.  TAKE CARE OF YOURSELF Take care of your breasts by:  Bathing or showering daily.   Avoiding the use of soaps on your nipples.   Start feedings on your left breast at one feeding and on your right breast at the next feeding.   You will notice an increase in your milk supply 2 to 5 days after delivery. You may feel some discomfort from engorgement, which makes your breasts very firm and often tender. Engorgement "peaks" out within 24 to 48 hours. In the meantime, apply warm moist towels to your breasts for 5 to 10 minutes before feeding. Gentle massage and expression of some milk before feeding will soften your breasts, making it easier for your baby to latch on. Wear a well fitting nursing  bra and air dry your nipples for 10 to 15 minutes after each feeding.   Only use cotton bra pads.   Only use pure lanolin on your nipples after nursing. You do not need to wash it off before nursing.  Take care of yourself by:   Eating well-balanced meals and nutritious snacks.   Drinking milk, fruit juice, and water to satisfy your thirst (about 8 glasses a day).   Getting plenty of rest.   Increasing calcium in your diet (1200 mg a day).   Avoiding foods that you notice affect the baby in a bad way.  SEEK MEDICAL CARE IF:   You have any questions or difficulty with breastfeeding.   You need help.   You have a hard, red, sore area on your breast, accompanied by a fever of 100.5 F (38.1 C) or more.   Your baby is too sleepy to eat well or is having trouble sleeping.   Your baby is wetting less than 6 diapers per day, by 5 days of age.   Your baby's skin or white part of his or her eyes is more yellow than it was in the hospital.   You feel depressed.  Document Released: 06/22/2005 Document Revised: 06/11/2011 Document Reviewed: 02/04/2009 [employer] Patient Information 2012 [employer]. 

## 2011-10-08 NOTE — Progress Notes (Signed)
Pt. Is doing well.  She reports loss of mucous plug.  Discussed diet.

## 2011-10-08 NOTE — Progress Notes (Signed)
Pulse: 72

## 2011-10-15 ENCOUNTER — Ambulatory Visit (INDEPENDENT_AMBULATORY_CARE_PROVIDER_SITE_OTHER): Payer: Medicaid Other | Admitting: Family Medicine

## 2011-10-15 VITALS — BP 113/78 | Temp 97.1°F | Wt 201.5 lb

## 2011-10-15 DIAGNOSIS — D696 Thrombocytopenia, unspecified: Secondary | ICD-10-CM

## 2011-10-15 DIAGNOSIS — O99340 Other mental disorders complicating pregnancy, unspecified trimester: Secondary | ICD-10-CM

## 2011-10-15 DIAGNOSIS — F1910 Other psychoactive substance abuse, uncomplicated: Secondary | ICD-10-CM

## 2011-10-15 DIAGNOSIS — O99891 Other specified diseases and conditions complicating pregnancy: Secondary | ICD-10-CM

## 2011-10-15 DIAGNOSIS — R300 Dysuria: Secondary | ICD-10-CM

## 2011-10-15 DIAGNOSIS — O99119 Other diseases of the blood and blood-forming organs and certain disorders involving the immune mechanism complicating pregnancy, unspecified trimester: Secondary | ICD-10-CM

## 2011-10-15 DIAGNOSIS — O99320 Drug use complicating pregnancy, unspecified trimester: Secondary | ICD-10-CM

## 2011-10-15 LAB — POCT URINALYSIS DIP (DEVICE)
Bilirubin Urine: NEGATIVE
Nitrite: NEGATIVE
Protein, ur: 30 mg/dL — AB
pH: 7 (ref 5.0–8.0)

## 2011-10-15 NOTE — Patient Instructions (Signed)
Normal Labor and Delivery Your caregiver must first be sure you are in labor. Signs of labor include:  You may pass what is called "the mucus plug" before labor begins. This is a small amount of blood stained mucus.   Regular uterine contractions.   The time between contractions get closer together.   The discomfort and pain gradually gets more intense.   Pains are mostly located in the back.   Pains get worse when walking.   The cervix (the opening of the uterus becomes thinner (begins to efface) and opens up (dilates).  Once you are in labor and admitted into the hospital or care center, your caregiver will do the following:  A complete physical examination.   Check your vital signs (blood pressure, pulse, temperature and the fetal heart rate).   Do a vaginal examination (using a sterile glove and lubricant) to determine:   The position (presentation) of the baby (head [vertex] or buttock first).   The level (station) of the baby's head in the birth canal.   The effacement and dilatation of the cervix.   You may have your pubic hair shaved and be given an enema depending on your caregiver and the circumstance.   An electronic monitor is usually placed on your abdomen. The monitor follows the length and intensity of the contractions, as well as the baby's heart rate.   Usually, your caregiver will insert an IV in your arm with a bottle of sugar water. This is done as a precaution so that medications can be given to you quickly during labor or delivery.  NORMAL LABOR AND DELIVERY IS DIVIDED UP INTO 3 STAGES: First Stage This is when regular contractions begin and the cervix begins to efface and dilate. This stage can last from 3 to 15 hours. The end of the first stage is when the cervix is 100% effaced and 10 centimeters dilated. Pain medications may be given by   Injection (morphine, demerol, etc.)   Regional anesthesia (spinal, caudal or epidural, anesthetics given in  different locations of the spine). Paracervical pain medication may be given, which is an injection of and anesthetic on each side of the cervix.  A pregnant woman may request to have "Natural Childbirth" which is not to have any medications or anesthesia during her labor and delivery. Second Stage This is when the baby comes down through the birth canal (vagina) and is born. This can take 1 to 4 hours. As the baby's head comes down through the birth canal, you may feel like you are going to have a bowel movement. You will get the urge to bear down and push until the baby is delivered. As the baby's head is being delivered, the caregiver will decide if an episiotomy (a cut in the perineum and vagina area) is needed to prevent tearing of the tissue in this area. The episiotomy is sewn up after the delivery of the baby and placenta. Sometimes a mask with nitrous oxide is given for the mother to breath during the delivery of the baby to help if there is too much pain. The end of Stage 2 is when the baby is fully delivered. Then when the umbilical cord stops pulsating it is clamped and cut. Third Stage The third stage begins after the baby is completely delivered and ends after the placenta (afterbirth) is delivered. This usually takes 5 to 30 minutes. After the placenta is delivered, a medication is given either by intravenous or injection to help contract   the uterus and prevent bleeding. The third stage is not painful and pain medication is usually not necessary. If an episiotomy was done, it is repaired at this time. After the delivery, the mother is watched and monitored closely for 1 to 2 hours to make sure there is no postpartum bleeding (hemorrhage). If there is a lot of bleeding, medication is given to contract the uterus and stop the bleeding. Document Released: 03/31/2008 Document Revised: 06/11/2011 Document Reviewed: 03/31/2008 [employer] Patient Information 2012 [employer]. 

## 2011-10-15 NOTE — Progress Notes (Signed)
Pelvic pressure and cramps in rib area. Pulse 76.

## 2011-10-15 NOTE — Progress Notes (Signed)
Having frequency, dysuria, urgency-check urine cx Repeat CBC Labor precautions, membranes stripped

## 2011-10-17 LAB — CULTURE, OB URINE

## 2011-10-20 ENCOUNTER — Encounter (HOSPITAL_COMMUNITY): Payer: Self-pay | Admitting: *Deleted

## 2011-10-20 ENCOUNTER — Inpatient Hospital Stay (HOSPITAL_COMMUNITY): Payer: Medicaid Other | Admitting: Anesthesiology

## 2011-10-20 ENCOUNTER — Encounter (HOSPITAL_COMMUNITY): Payer: Self-pay | Admitting: Anesthesiology

## 2011-10-20 ENCOUNTER — Inpatient Hospital Stay (HOSPITAL_COMMUNITY)
Admission: AD | Admit: 2011-10-20 | Discharge: 2011-10-22 | DRG: 775 | Disposition: A | Discharge status: Home / Self Care | Payer: Medicaid Other | Source: Ambulatory Visit | Attending: Obstetrics and Gynecology | Admitting: Obstetrics and Gynecology

## 2011-10-20 DIAGNOSIS — O99320 Drug use complicating pregnancy, unspecified trimester: Secondary | ICD-10-CM

## 2011-10-20 DIAGNOSIS — O99344 Other mental disorders complicating childbirth: Secondary | ICD-10-CM | POA: Diagnosis present

## 2011-10-20 DIAGNOSIS — F1410 Cocaine abuse, uncomplicated: Secondary | ICD-10-CM | POA: Diagnosis present

## 2011-10-20 LAB — URINALYSIS, ROUTINE W REFLEX MICROSCOPIC
Bilirubin Urine: NEGATIVE
Hgb urine dipstick: NEGATIVE
Specific Gravity, Urine: <1.005 — ABNORMAL LOW (ref 1.005–1.030)
pH: 7.5 (ref 5.0–8.0)

## 2011-10-20 LAB — CBC
MCH: 31.8 pg (ref 26.0–34.0)
MCH: 30.9 pg (ref 26.0–34.0)
MCHC: 33.5 g/dL (ref 30.0–36.0)
Platelets: 117 10*3/uL — ABNORMAL LOW (ref 150–400)
Platelets: 102 10*3/uL — ABNORMAL LOW (ref 150–400)
RBC: 4.02 MIL/uL (ref 3.87–5.11)
RBC: 3.69 MIL/uL — ABNORMAL LOW (ref 3.87–5.11)
RDW: 14 % (ref 11.5–15.5)
WBC: 12.8 10*3/uL — ABNORMAL HIGH (ref 4.0–10.5)

## 2011-10-20 LAB — RAPID URINE DRUG SCREEN, HOSP PERFORMED
Cocaine: NOT DETECTED
Opiates: NOT DETECTED

## 2011-10-20 LAB — RPR: RPR Ser Ql: NONREACTIVE

## 2011-10-20 MED ORDER — LACTATED RINGERS IV SOLN: 500.0000 mL | Freq: Once | INTRAVENOUS | Status: DC

## 2011-10-20 MED ORDER — DIPHENHYDRAMINE HCL 50 MG/ML IJ SOLN: 12.5000 mg | INTRAMUSCULAR | Status: DC | PRN

## 2011-10-20 MED ORDER — FENTANYL 2.5 MCG/ML BUPIVACAINE 1/10 % EPIDURAL INFUSION (WH - ANES)
INTRAMUSCULAR | Status: DC | PRN
  Administered 2011-10-20: 14 mL/h via EPIDURAL

## 2011-10-20 MED ORDER — CITRIC ACID-SODIUM CITRATE 334-500 MG/5ML PO SOLN: 30.0000 mL | ORAL | Status: DC | PRN

## 2011-10-20 MED ORDER — OXYTOCIN BOLUS FROM INFUSION
500.0000 mL | Freq: Once | INTRAVENOUS | Status: DC
  Filled 2011-10-20: qty 500

## 2011-10-20 MED ORDER — FLEET ENEMA 7-19 GM/118ML RE ENEM: 1.0000 | ENEMA | RECTAL | Status: DC | PRN

## 2011-10-20 MED ORDER — PHENYLEPHRINE 40 MCG/ML (10ML) SYRINGE FOR IV PUSH (FOR BLOOD PRESSURE SUPPORT)
80.0000 ug | PREFILLED_SYRINGE | INTRAVENOUS | Status: DC | PRN
  Filled 2011-10-20: qty 5

## 2011-10-20 MED ORDER — PHENYLEPHRINE 40 MCG/ML (10ML) SYRINGE FOR IV PUSH (FOR BLOOD PRESSURE SUPPORT): 80.0000 ug | PREFILLED_SYRINGE | INTRAVENOUS | Status: DC | PRN

## 2011-10-20 MED ORDER — FENTANYL 2.5 MCG/ML BUPIVACAINE 1/10 % EPIDURAL INFUSION (WH - ANES)
14.0000 mL/h | INTRAMUSCULAR | Status: DC
  Administered 2011-10-20 (×2): 14 mL/h via EPIDURAL
  Filled 2011-10-20 (×3): qty 60

## 2011-10-20 MED ORDER — ACETAMINOPHEN 325 MG PO TABS: 650.0000 mg | ORAL_TABLET | ORAL | Status: DC | PRN

## 2011-10-20 MED ORDER — IBUPROFEN 600 MG PO TABS: 600.0000 mg | ORAL_TABLET | Freq: Four times a day (QID) | ORAL | Status: DC | PRN

## 2011-10-20 MED ORDER — LIDOCAINE HCL (PF) 1 % IJ SOLN
INTRAMUSCULAR | Status: DC | PRN
  Administered 2011-10-20: 8 mL
  Administered 2011-10-20: 7 mL

## 2011-10-20 MED ORDER — ONDANSETRON HCL 4 MG/2ML IJ SOLN
4.0000 mg | Freq: Four times a day (QID) | INTRAMUSCULAR | Status: DC | PRN
  Administered 2011-10-20: 4 mg via INTRAVENOUS
  Filled 2011-10-20: qty 2

## 2011-10-20 MED ORDER — LACTATED RINGERS IV SOLN: 500.0000 mL | INTRAVENOUS | Status: DC | PRN

## 2011-10-20 MED ORDER — BUTORPHANOL TARTRATE 2 MG/ML IJ SOLN
1.0000 mg | INTRAMUSCULAR | Status: DC | PRN
  Administered 2011-10-20: 1 mg via INTRAMUSCULAR
  Filled 2011-10-20: qty 1

## 2011-10-20 MED ORDER — EPHEDRINE 5 MG/ML INJ
10.0000 mg | INTRAVENOUS | Status: DC | PRN
  Filled 2011-10-20: qty 4

## 2011-10-20 MED ORDER — OXYTOCIN 20 UNITS IN LACTATED RINGERS INFUSION - SIMPLE
125.0000 mL/h | Freq: Once | INTRAVENOUS | Status: AC
  Administered 2011-10-20: 125 mL/h via INTRAVENOUS
  Filled 2011-10-20: qty 1000

## 2011-10-20 MED ORDER — LACTATED RINGERS IV SOLN
INTRAVENOUS | Status: DC
  Administered 2011-10-20 (×3): via INTRAVENOUS

## 2011-10-20 MED ORDER — EPHEDRINE 5 MG/ML INJ: 10.0000 mg | INTRAVENOUS | Status: DC | PRN

## 2011-10-20 MED ORDER — LIDOCAINE HCL (PF) 1 % IJ SOLN: 30.0000 mL | INTRAMUSCULAR | Status: DC | PRN

## 2011-10-20 MED ORDER — OXYCODONE-ACETAMINOPHEN 5-325 MG PO TABS: 1.0000 | ORAL_TABLET | ORAL | Status: DC | PRN

## 2011-10-20 NOTE — Progress Notes (Signed)
Patient ID: [name] [name], female   DOB: 11-03-1980, 31 y.o.   MRN: [id number] Subjective:  [name] [name] is a 31 y.o. G2 P1 female with [name] [name] 10/28/11 at 30 and 6/[redacted] weeks gestation who is being admitted for observation.  Her current obstetrical history is significant for drug use during pregnancy.  Patient reports contractions.   Fetal Movement: patient states she can't feel movement. ok on the monitor.  Having contractions every 4-5 minutes - she says its very painful. She was seen at [facility] ER last night, was observed and discharged after being determined to not be in labor. She c/o dysuria and discharge.    Objective:   Vital signs in last 24 hours: Temp:  [98.3 F (36.8 C)] 98.3 F (36.8 C) (04/16 1048) Pulse Rate:  [78] 78  (04/16 1048) BP: (123)/(71) 123/71 mmHg (04/16 1048) Weight:  [202 lb (91.627 kg)] 202 lb (91.627 kg) (04/16 1052)   General:   alert, cooperative and no distress  Skin:   normal  HEENT:  PERRLA  Lungs:   clear to auscultation bilaterally  Heart:   regular rate and rhythm, S1, S2 normal, no murmur, click, rub or gallop  Breasts:   deferred  Abdomen:  gravid, nt  Pelvis:  Vulva and vagina appear normal. Bimanual exam reveals normal uterus and adnexa. Vaginal: normal mucosa without prolapse or lesions Cervix: normal appearance  FHT:  150 BPM  Uterine Size: size equals dates  Presentations: cephalic  Cervix:    Dilation: 4   Effacement: 50%   Station:  -2   Consistency: medium   Position: posterior   Lab Review  GBS negative  [id number] declined  One hour GTT: Normal    Assessment/Plan:  30 and 6/[redacted] weeks gestation. Contractions every 4-5 minutes, painful, and 4 cm Obstetrical history significant for cocaine usage.   UDS U/A via clean cath  Lesslie Mossa MD 11:31 AM 10/20/2011  Questionable labor, re-evaluate in 2 hours.

## 2011-10-20 NOTE — Progress Notes (Signed)
[name] [name] is a 31 y.o. G2P1001 at [redacted]w[redacted]d by ultrasound admitted for active labor  Subjective: C/o some nausea No cp/sob Examined patient, no signs of preeclampsia.   Objective: BP 97/47  Pulse 86  Temp(Src) 98.2 F (36.8 C) (Oral)  Ht 5\' 7"  (1.702 m)  Wt 202 lb (91.627 kg)  BMI 31.64 kg/m2  SpO2 99%  LMP 01/16/2011      FHT:  FHR: 145 bpm, variability: moderate,  accelerations:  Present,  decelerations:  Absent UC:   regular, every 4 minutes SVE:   Dilation: 7 Effacement (%): 70 Station: -1 Exam by:: Edd Arbour MD  Labs: Lab Results  Component Value Date   WBC 11.8* 10/20/2011   HGB 12.8 10/20/2011   HCT 38.2 10/20/2011   MCV 95.0 10/20/2011   PLT 117* 10/20/2011    Assessment / Plan: Spontaneous labor, progressing normally  Labor: Progressing normally Preeclampsia:  no signs or symptoms of toxicity Fetal Wellbeing:  Category I Pain Control:  Epidural I/D:  n/a Anticipated MOD:  NSVD  Bradly Sangiovanni MD 10/20/2011, 3:24 PM

## 2011-10-20 NOTE — Anesthesia Preprocedure Evaluation (Signed)
Anesthesia Evaluation  Patient identified by MRN, date of birth, ID band Patient awake    Reviewed: Allergy & Precautions, H&P , NPO status , Patient's Chart, lab work & pertinent test results  Airway Mallampati: II TM Distance: >3 FB Neck ROM: full    Dental No notable dental hx.    Pulmonary neg pulmonary ROS,  breath sounds clear to auscultation  Pulmonary exam normal       Cardiovascular negative cardio ROS      Neuro/Psych negative neurological ROS  negative psych ROS   GI/Hepatic negative GI ROS, Neg liver ROS,   Endo/Other  negative endocrine ROS  Renal/GU negative Renal ROS  negative genitourinary   Musculoskeletal negative musculoskeletal ROS (+)   Abdominal Normal abdominal exam  (+)   Peds negative pediatric ROS (+)  Hematology negative hematology ROS (+)   Anesthesia Other Findings   Reproductive/Obstetrics (+) Pregnancy                           Anesthesia Physical Anesthesia Plan  ASA: II  Anesthesia Plan: Epidural   Post-op Pain Management:    Induction:   Airway Management Planned:   Additional Equipment:   Intra-op Plan:   Post-operative Plan:   Informed Consent: I have reviewed the patients History and Physical, chart, labs and discussed the procedure including the risks, benefits and alternatives for the proposed anesthesia with the patient or authorized representative who has indicated his/her understanding and acceptance.     Plan Discussed with:   Anesthesia Plan Comments:         Anesthesia Quick Evaluation  

## 2011-10-20 NOTE — Anesthesia Procedure Notes (Signed)
Epidural Patient location during procedure: OB Start time: 10/20/2011 2:02 PM End time: 10/20/2011 2:07 PM Reason for block: procedure for pain  Staffing Anesthesiologist: Sandrea Hughs  Preanesthetic Checklist Completed: patient identified, site marked, surgical consent, pre-op evaluation, timeout performed, IV checked, risks and benefits discussed and monitors and equipment checked  Epidural Patient position: sitting Prep: site prepped and draped and DuraPrep Patient monitoring: continuous pulse ox and blood pressure Approach: midline Injection technique: LOR air  Needle:  Needle type: Tuohy  Needle gauge: 17 G Needle length: 9 cm Needle insertion depth: 6 cm Catheter type: closed end flexible Catheter size: 19 Gauge Catheter at skin depth: 11 cm Test dose: negative and Other  Assessment Sensory level: T9 Events: blood not aspirated, injection not painful, no injection resistance, negative IV test and no paresthesia

## 2011-10-20 NOTE — Progress Notes (Signed)
[name] [name] is a 31 y.o. G2P1001 at [redacted]w[redacted]d by   Subjective: Doing well. Feeling pressure but no pain. No urge to push  Objective: BP 89/54  Pulse 80  Temp(Src) 98.4 F (36.9 C) (Oral)  Resp 20  Ht 5\' 7"  (1.702 m)  Wt 91.627 kg (202 lb)  BMI 31.64 kg/m2  SpO2 99%  LMP 01/16/2011      FHT:  FHR: 130s bpm, variability: moderate,  accelerations:  Present,  decelerations:  Present  Late Decelerations UC:   regular, every 2-3 minutes SVE:   Dilation: 9 Effacement (%): 100 Station: 0;+1 Exam by:: Dr Konrad Dolores  Labs: Lab Results  Component Value Date   WBC 11.8* 10/20/2011   HGB 12.8 10/20/2011   HCT 38.2 10/20/2011   MCV 95.0 10/20/2011   PLT 117* 10/20/2011    Assessment / Plan: Spontaneous labor, progressing normally  Labor: Progressing normally Fetal Wellbeing:  Category II Pain Control:  Epidural I/D:  n/a Anticipated MOD:  NSVD  [name] 10/20/2011, 8:55 PM

## 2011-10-20 NOTE — Progress Notes (Signed)
[name] [name] is a 31 y.o. G2P1001 at [redacted]w[redacted]d   Subjective: Comfortable with epidural  Objective: BP 102/69  Pulse 81  Temp(Src) 98.2 F (36.8 C) (Oral)  Resp 18  Ht 5\' 7"  (1.702 m)  Wt 91.627 kg (202 lb)  BMI 31.64 kg/m2  SpO2 99%  LMP 01/16/2011      FHT:  FHR: 125 bpm, variability: moderate,  accelerations:  Present,  decelerations:  Absent UC:   irregular, every 4-6 minutes SVE:   Dilation: 8 Effacement (%): 100 Station: +1 Exam by:: Clelia Croft, CNM  Labs: Lab Results  Component Value Date   WBC 11.8* 10/20/2011   HGB 12.8 10/20/2011   HCT 38.2 10/20/2011   MCV 95.0 10/20/2011   PLT 117* 10/20/2011    Assessment / Plan: IUP at term Active labor  Await urge to push, or reexamine cx in 1-2 hours    [name] 10/20/2011, 5:58 PM

## 2011-10-20 NOTE — H&P (Signed)
  Subjective:  [name] [name] is a 31 y.o. G2 P1 female with EDC 10/28/11 at 45 and 6/[redacted] weeks gestation who is being admitted for observation. Her current obstetrical history is significant for drug use during pregnancy. Patient reports contractions. Fetal Movement: patient states she can't feel movement. ok on the monitor.  Having contractions every 4-5 minutes - she says its very painful.  She was seen at [facility] ER last night, was observed and discharged after being determined to not be in labor.  She c/o dysuria and discharge.  Objective:   Vital signs in last 24 hours:  Temp: [98.3 F (36.8 C)] 98.3 F (36.8 C) (04/16 1048)  Pulse Rate: [78] 78 (04/16 1048)  BP: (123)/(71) 123/71 mmHg (04/16 1048)  Weight: [202 lb (91.627 kg)] 202 lb (91.627 kg) (04/16 1052)  General:  alert, cooperative and no distress   Skin:  normal   HEENT:  PERRLA   Lungs:  clear to auscultation bilaterally   Heart:  regular rate and rhythm, S1, S2 normal, no murmur, click, rub or gallop   Breasts:  deferred   Abdomen:  gravid, nt   Pelvis:  Vulva and vagina appear normal. Bimanual exam reveals normal uterus and adnexa.  Vaginal: normal mucosa without prolapse or lesions  Cervix: normal appearance    FHT:  150 BPM   Uterine Size:  size equals dates   Presentations:  cephalic   Cervix:     Dilation:  4    Effacement:  50%    Station:  -2    Consistency:  medium    Position:  posterior   Lab Review  GBS negative  XLK:GMWNUUV declined  One hour GTT: Normal  Assessment/Plan:  38 and 6/[redacted] weeks gestation.  Contractions every 4-5 minutes, painful, and 4 cm  Obstetrical history significant for cocaine usage.  UDS  U/A via clean cath  ORTON,JONATHAN MD  11:31 AM  10/20/2011  Questionable labor, re-evaluate in 2 hours.   Pt with cx change- now admitted. [name] 10/20/2011 10:46 PM

## 2011-10-21 MED ORDER — ZOLPIDEM TARTRATE 5 MG PO TABS: 5.0000 mg | ORAL_TABLET | Freq: Every evening | ORAL | Status: DC | PRN

## 2011-10-21 MED ORDER — WITCH HAZEL-GLYCERIN EX PADS: 1.0000 "application " | MEDICATED_PAD | CUTANEOUS | Status: DC | PRN

## 2011-10-21 MED ORDER — BENZOCAINE-MENTHOL 20-0.5 % EX AERO
INHALATION_SPRAY | CUTANEOUS | Status: AC
  Filled 2011-10-21: qty 56

## 2011-10-21 MED ORDER — TETANUS-DIPHTH-ACELL PERTUSSIS 5-2.5-18.5 LF-MCG/0.5 IM SUSP: 0.5000 mL | Freq: Once | INTRAMUSCULAR | Status: DC

## 2011-10-21 MED ORDER — DIBUCAINE 1 % RE OINT: 1.0000 "application " | TOPICAL_OINTMENT | RECTAL | Status: DC | PRN

## 2011-10-21 MED ORDER — BENZOCAINE-MENTHOL 20-0.5 % EX AERO: 1.0000 "application " | INHALATION_SPRAY | CUTANEOUS | Status: DC | PRN

## 2011-10-21 MED ORDER — LANOLIN HYDROUS EX OINT: TOPICAL_OINTMENT | CUTANEOUS | Status: DC | PRN

## 2011-10-21 MED ORDER — PRENATAL MULTIVITAMIN CH
1.0000 | ORAL_TABLET | Freq: Every day | ORAL | Status: DC
  Administered 2011-10-21 – 2011-10-22 (×2): 1 via ORAL
  Filled 2011-10-21 (×2): qty 1

## 2011-10-21 MED ORDER — DIPHENHYDRAMINE HCL 25 MG PO CAPS: 25.0000 mg | ORAL_CAPSULE | Freq: Four times a day (QID) | ORAL | Status: DC | PRN

## 2011-10-21 MED ORDER — ONDANSETRON HCL 4 MG/2ML IJ SOLN: 4.0000 mg | INTRAMUSCULAR | Status: DC | PRN

## 2011-10-21 MED ORDER — SIMETHICONE 80 MG PO CHEW: 80.0000 mg | CHEWABLE_TABLET | ORAL | Status: DC | PRN

## 2011-10-21 MED ORDER — OXYCODONE-ACETAMINOPHEN 5-325 MG PO TABS
1.0000 | ORAL_TABLET | ORAL | Status: DC | PRN
  Administered 2011-10-21 (×4): 1 via ORAL
  Administered 2011-10-22: 2 via ORAL
  Administered 2011-10-22: 1 via ORAL
  Filled 2011-10-21 (×3): qty 1
  Filled 2011-10-21: qty 2
  Filled 2011-10-21 (×2): qty 1

## 2011-10-21 MED ORDER — IBUPROFEN 600 MG PO TABS
600.0000 mg | ORAL_TABLET | Freq: Four times a day (QID) | ORAL | Status: DC
  Administered 2011-10-21 – 2011-10-22 (×5): 600 mg via ORAL
  Filled 2011-10-21 (×5): qty 1

## 2011-10-21 MED ORDER — SENNOSIDES-DOCUSATE SODIUM 8.6-50 MG PO TABS
2.0000 | ORAL_TABLET | Freq: Every day | ORAL | Status: DC
  Administered 2011-10-21: 2 via ORAL

## 2011-10-21 MED ORDER — ONDANSETRON HCL 4 MG PO TABS: 4.0000 mg | ORAL_TABLET | ORAL | Status: DC | PRN

## 2011-10-21 NOTE — Progress Notes (Signed)
I have seen and examined this patient and I agree with the above. Adriana Lina 9:09 AM 10/21/2011    

## 2011-10-21 NOTE — Progress Notes (Signed)
Clinical Social Work Department  PSYCHOSOCIAL ASSESSMENT - MATERNAL/CHILD  10/21/2011  Patient: [name] [name] Account Number: [id number] Admit Date: 10/20/2011  Childs Name:  [name] [name]   Clinical Social Worker: [name], LCSWA Date/Time: 10/21/2011 11:00 AM  Date Referred: 10/21/2011  Referral source   CN    Referred reason   Other - See comment   Other referral source:  I: FAMILY / HOME ENVIRONMENT  Child's legal guardian: PARENT  Guardian - Name  Guardian - Age  Guardian - Address   [name] [name]  31  [address] [name], [place] [address]   [name]     Other household support members/support persons  Other support:  [name] & [name] [name], parents   II PSYCHOSOCIAL DATA  Information Source: Patient Interview  Financial and Community Resources  Employment:  Financial resources: Medicaid  If Medicaid - County: [place]  School / Grade:  Maternity Care Coordinator / Child Services Coordination / Early Interventions:  [name]   Cultural issues impacting care:  III STRENGTHS  Strengths   Adequate Resources   Home prepared for Child (including basic supplies)   Supportive family/friends   Strength comment:  IV RISK FACTORS AND CURRENT PROBLEMS  Current Problem: YES  Risk Factor & Current Problem  Patient Issue  Family Issue  Risk Factor / Current Problem Comment   Substance Abuse  Y  N  History of MJ, Cocaine, Xanax use   V SOCIAL WORK ASSESSMENT  Sw met with pt to assess current social situation re: history of substance abuse and incarceration. Pt admits to using drugs "on & off" for the past 10 years. Pt's drugs of choice are cocaine, MJ and xanax. Pt was charged with possession of narcotics in 2008 and placed on probation. She violated her probation and was sentenced to 7 months in prison (7/06-05-12). Once pt was released from prison, she went to live in her parents rental home until she relapsed 09/02/11. Pt told Sw that she went to [name] to  request treatment and was referred to [name]. [facility] was accepted into [name]/11. Pt did not complete the program, as she told this Sw she had problems with the staff. She left the program last Thursday, April 11th. She denies any substance use since her relapse in February and was not able to explain positive drug screen 09/15/11 (cocaine & MJ) or 09/28/11 (amphetamines). Pt told Sw that she never used amphetamines in her life and not sure how she tested positive. She was prescribed Zantac and prenatal vitamins during pregnancy. Sw explained hospital drug testing policy. UDS is negative, meconium results are pending. Pt reports some periods of sobriety in the past 10 yrs. She sought treatment at [facility] in 2009. She was reports being clean for the 7 months while in prison and the weeks that she was living at [name]. She reports having all the necessary supplies for the infant. She identified her parents, as her primary support system. FOB is not involved and she is not sure where he is. Pt spoke openly with this Sw and was receptive to consult. Pt verbalized understanding of consult and the need to report history to CPS. She states, " I have nothing to hide and need all the help I can get." She is working closely with her Maternity Care Coordinator, [name]. Pt's 30 year old has been with her parents since he was 2. She denies any past CPS involvement. Sw report to   [place] CPS based on history and recent substance use. Sw will continue to assist and follow as needed. Pt was appropriate during assessment.   VI SOCIAL WORK PLAN  Social Work Plan   Child Protective Services Report   Type of pt/family education:  If child protective services report - county: [place]  If child protective services report - date: 10/21/2011  Information/referral to community resources comment:  Other social work plan:   

## 2011-10-21 NOTE — Progress Notes (Signed)
Post Partum Day 1 Subjective: no complaints, up ad lib, voiding, tolerating PO and + flatus  Objective: Blood pressure 118/78, pulse 64, temperature 97.3 F (36.3 C), temperature source Oral, resp. rate 18, height 5\' 7"  (1.702 m), weight 91.627 kg (202 lb), last menstrual period 01/16/2011, SpO2 94.00%, unknown if currently breastfeeding.  Physical Exam:  General: alert, cooperative and no distress Lochia: appropriate Uterine Fundus: soft DVT Evaluation: No evidence of DVT seen on physical exam. Trace LE edema  Basename 10/20/11 2325 10/20/11 1258  HGB 11.4* 12.8  HCT 35.3* 38.2    Assessment/Plan: Plan for discharge tomorrow, Breastfeeding, Lactation consult, Social Work consult and Contraception IUD   LOS: 1 day   MERRELL, DAVID 10/21/2011, 7:37 AM

## 2011-10-21 NOTE — Progress Notes (Signed)
UR chart review completed.  

## 2011-10-21 NOTE — Anesthesia Postprocedure Evaluation (Signed)
  Anesthesia Post-op Note  Patient: [name] [name]  Procedure(s) Performed: * No procedures listed *  Patient Location: Mother/Baby  Anesthesia Type: Epidural  Level of Consciousness: awake, alert  and oriented  Airway and Oxygen Therapy: Patient Spontanous Breathing  Post-op Pain: none  Post-op Assessment: Post-op Vital signs reviewed, Patient's Cardiovascular Status Stable, No headache, No backache, No residual numbness and No residual motor weakness  Post-op Vital Signs: Reviewed and stable  Complications: No apparent anesthesia complications

## 2011-10-22 MED ORDER — OXYCODONE-ACETAMINOPHEN 5-325 MG PO TABS: 1.0000 | ORAL_TABLET | ORAL | Status: DC | PRN

## 2011-10-22 MED ORDER — IBUPROFEN 600 MG PO TABS: 600.0000 mg | ORAL_TABLET | Freq: Four times a day (QID) | ORAL | Status: AC

## 2011-10-22 NOTE — Discharge Summary (Signed)
Obstetric Discharge Summary Reason for Admission: onset of labor Prenatal Procedures: none Intrapartum Procedures: spontaneous vaginal delivery Postpartum Procedures: none Complications-Operative and Postpartum: vaginal laceration Hemoglobin  Date Value Range Status  10/20/2011 11.4* 12.0-15.0 (g/dL) Final     HCT  Date Value Range Status  10/20/2011 35.3* 36.0-46.0 (%) Final    Physical Exam:  General: alert, cooperative and no distress Lochia: appropriate (one brief episode of vaginal bleeding overnight,but otherwise minimal spotting) Uterine Fundus: firm DVT Evaluation: No evidence of DVT seen on physical exam.  Discharge Diagnoses: Term Pregnancy-delivered  Discharge Information: Date: 10/22/2011 Activity: unrestricted and pelvic rest Diet: routine Medications: Ibuprofen and Percocet Condition: stable Instructions: refer to practice specific booklet Discharge to: home CPS to be involved after discussion w/ SW, due to prenatal drug use and unstable home.  Newborn Data: Live born female  Birth Weight: 8 lb 5.7 oz (3790 g) APGAR: 8, 9  Home with mother.  [name] [name] 10/22/2011, 7:42 AM

## 2011-10-26 ENCOUNTER — Encounter: Payer: Medicaid Other | Admitting: Obstetrics & Gynecology

## 2011-10-26 NOTE — Progress Notes (Signed)
Agree with above note.  [name] [name] 10/26/2011 9:44 AM

## 2011-10-26 NOTE — H&P (Signed)
Agree with above note.  [name] [name] 10/26/2011 9:45 AM   

## 2011-11-11 ENCOUNTER — Ambulatory Visit: Payer: Medicaid Other | Admitting: Advanced Practice Midwife

## 2014-05-07 ENCOUNTER — Encounter (HOSPITAL_COMMUNITY): Payer: Self-pay | Admitting: *Deleted

## 2015-04-01 ENCOUNTER — Emergency Department (HOSPITAL_BASED_OUTPATIENT_CLINIC_OR_DEPARTMENT_OTHER)
Admission: EM | Admit: 2015-04-01 | Discharge: 2015-04-01 | Disposition: A | Discharge status: Home / Self Care | Payer: Medicaid Other | Attending: Physician Assistant | Admitting: Physician Assistant

## 2015-04-01 ENCOUNTER — Encounter (HOSPITAL_BASED_OUTPATIENT_CLINIC_OR_DEPARTMENT_OTHER): Payer: Self-pay | Admitting: *Deleted

## 2015-04-01 DIAGNOSIS — M545 Low back pain, unspecified: Secondary | ICD-10-CM

## 2015-04-01 DIAGNOSIS — R200 Anesthesia of skin: Secondary | ICD-10-CM | POA: Insufficient documentation

## 2015-04-01 DIAGNOSIS — Z87891 Personal history of nicotine dependence: Secondary | ICD-10-CM | POA: Diagnosis not present

## 2015-04-01 DIAGNOSIS — Z3202 Encounter for pregnancy test, result negative: Secondary | ICD-10-CM | POA: Insufficient documentation

## 2015-04-01 DIAGNOSIS — Z79899 Other long term (current) drug therapy: Secondary | ICD-10-CM | POA: Diagnosis not present

## 2015-04-01 LAB — URINALYSIS, ROUTINE W REFLEX MICROSCOPIC
BILIRUBIN URINE: NEGATIVE
GLUCOSE, UA: NEGATIVE mg/dL
HGB URINE DIPSTICK: NEGATIVE
KETONES UR: NEGATIVE mg/dL
Nitrite: NEGATIVE
PROTEIN: NEGATIVE mg/dL
Specific Gravity, Urine: 1.021 (ref 1.005–1.030)
UROBILINOGEN UA: 1 mg/dL (ref 0.0–1.0)
pH: 6.5 (ref 5.0–8.0)

## 2015-04-01 LAB — PREGNANCY, URINE: PREG TEST UR: NEGATIVE

## 2015-04-01 LAB — URINE MICROSCOPIC-ADD ON

## 2015-04-01 MED ORDER — CYCLOBENZAPRINE HCL 10 MG PO TABS: 10.0000 mg | ORAL_TABLET | Freq: Two times a day (BID) | ORAL | Status: DC | PRN

## 2015-04-01 MED ORDER — CYCLOBENZAPRINE HCL 10 MG PO TABS
10.0000 mg | ORAL_TABLET | Freq: Once | ORAL | Status: AC
  Administered 2015-04-01: 10 mg via ORAL
  Filled 2015-04-01: qty 1

## 2015-04-01 MED ORDER — IBUPROFEN 800 MG PO TABS: 800.0000 mg | ORAL_TABLET | Freq: Three times a day (TID) | ORAL | Status: DC

## 2015-04-01 MED ORDER — IBUPROFEN 800 MG PO TABS
800.0000 mg | ORAL_TABLET | Freq: Once | ORAL | Status: AC
  Administered 2015-04-01: 800 mg via ORAL
  Filled 2015-04-01: qty 1

## 2015-04-01 NOTE — ED Notes (Signed)
Patient states she started she can move, her back feels pressure and she has to have a bowel movement at this moment.

## 2015-04-01 NOTE — ED Provider Notes (Signed)
CSN: [id number]   Arrival date & time 04/01/15 1444  History  This chart was scribed for Courteney Lyn Corlis Leak, MD by Bethel Born, ED Scribe. This patient was seen in room MH09/MH09 and the patient's care was started at 4:31 PM.  Chief Complaint  Patient presents with  . Back Pain    HPI The history is provided by the patient. No language interpreter was used.   [name] [name] is a 34 y.o. female who presents to the Emergency Department complaining of constant right lower back pain with onset 1 week ago upon waking. 2 days ago the pain worsened. The pain is elicited with movement and worse with sitting. Standing up improves the pain.  She believes that she pulled a muscle. Flexeril provided some relief in pain 2 days ago. Pt notes that bending at the waist produces a sensation as if she needs to have a bowel movement. Associated symptoms include mild and intermittent left leg numbness. Pt denies fever, weakness, and incontinence of bowel or bladder.   Past Medical History  Diagnosis Date  . No pertinent past medical history     Past Surgical History  Procedure Laterality Date  . No past surgeries      Family History  Problem Relation Age of Onset  . Anesthesia problems Neg Hx     Social History  Substance Use Topics  . Smoking status: Former Smoker -- 13 years    Types: Cigarettes  . Smokeless tobacco: Former Neurosurgeon    Quit date: 09/04/2011  . Alcohol Use: No     Comment: last use of marijuana was 09-02-11 per pt. Last result here on 09-15-11 was positive for cocaine and Marijuana     Review of Systems  Constitutional: Negative for fever and chills.  Gastrointestinal: Negative for nausea and vomiting.  Musculoskeletal: Positive for back pain.  Neurological: Positive for numbness. Negative for weakness.  All other systems reviewed and are negative.  Home Medications   Prior to Admission medications   Medication Sig Start Date End Date Taking? Authorizing Provider   omeprazole (PRILOSEC OTC) 20 MG tablet Take 1 tablet (20 mg total) by mouth daily. 10/08/11 10/07/12  Reva Bores, MD  Prenatal Vit-Fe Fumarate-FA (PRENATAL MULTIVITAMIN) TABS Take 1 tablet by mouth daily.    Historical Provider, MD    Allergies  Risperidone  Triage Vitals: BP 110/72 mmHg  Pulse 84  Temp(Src) 97.7 F (36.5 C) (Oral)  Resp 20  Ht  (1.702 m)  Wt 202 lb (91.627 kg)  BMI 31.63 kg/m2  SpO2 98%  LMP 03/11/2015  Physical Exam  Constitutional: She is oriented to person, place, and time. She appears well-developed and well-nourished.  HENT:  Head: Normocephalic.  Eyes: EOM are normal.  Neck: Normal range of motion.  Cardiovascular: Normal rate, regular rhythm and normal heart sounds.   Pulmonary/Chest: Effort normal and breath sounds normal.  CTAB  Abdominal: She exhibits no distension.  Musculoskeletal: Normal range of motion.  Right lateral lumbar tenderness  Neurological: She is alert and oriented to person, place, and time.  Pt able to ambulate in the department  Psychiatric: She has a normal mood and affect.  Nursing note and vitals reviewed.   ED Course  Procedures   DIAGNOSTIC STUDIES: Oxygen Saturation is 98% on RA, normal by my interpretation.    COORDINATION OF CARE: 4:36 PM Discussed treatment plan which includes lab work and pain management with pt at bedside and pt agreed to plan.  Labs Reviewed  URINALYSIS, ROUTINE W REFLEX MICROSCOPIC (NOT AT [facility]) - Abnormal; Notable for the following:    APPearance CLOUDY (*)    Leukocytes, UA TRACE (*)    All other components within normal limits  URINE MICROSCOPIC-ADD ON - Abnormal; Notable for the following:    Squamous Epithelial / LPF MANY (*)    All other components within normal limits  PREGNANCY, URINE    Imaging Review No results found.    MDM   Final diagnoses:  None   Patient is a 34 year old female presenting with back pain. Patient lower back pain started one week ago. It is  made worse by movement or bending over. Right lateral pain. Patient's family has history of lower back pain that is chronic. Patient is coming in to make sure that she did not have a different cause for back pain, like urinary tract infection.  No urinary tract symptoms symptoms. Urine here is negative. No red flags no fevers no numbness or tingling no weakness.  We'll treat with ibuprofen and Flexeril have follow-up with her PCP.  I personally performed the services described in this documentation, which was scribed in my presence. The recorded information has been reviewed and is accurate.    Courteney Randall An, MD 04/01/15 1650

## 2015-04-01 NOTE — Discharge Instructions (Signed)

## 2015-04-01 NOTE — ED Notes (Signed)
Lower back pain for a week. Now the pain goes into her hips. Greater on the left.

## 2015-04-01 NOTE — ED Notes (Signed)
Patient understands discharge instructions and medication.  She is stable and ambulatory.  She will follow-up with PCP.

## 2015-04-01 NOTE — ED Notes (Signed)
MD at bedside. 

## 2015-05-24 ENCOUNTER — Emergency Department (HOSPITAL_BASED_OUTPATIENT_CLINIC_OR_DEPARTMENT_OTHER): Payer: Medicaid Other

## 2015-05-24 ENCOUNTER — Encounter (HOSPITAL_BASED_OUTPATIENT_CLINIC_OR_DEPARTMENT_OTHER): Payer: Self-pay | Admitting: *Deleted

## 2015-05-24 ENCOUNTER — Emergency Department (HOSPITAL_BASED_OUTPATIENT_CLINIC_OR_DEPARTMENT_OTHER)
Admission: EM | Admit: 2015-05-24 | Discharge: 2015-05-24 | Disposition: A | Discharge status: Home / Self Care | Payer: Medicaid Other | Attending: Emergency Medicine | Admitting: Emergency Medicine

## 2015-05-24 DIAGNOSIS — M25552 Pain in left hip: Secondary | ICD-10-CM | POA: Diagnosis present

## 2015-05-24 DIAGNOSIS — Z791 Long term (current) use of non-steroidal anti-inflammatories (NSAID): Secondary | ICD-10-CM | POA: Insufficient documentation

## 2015-05-24 DIAGNOSIS — Z87891 Personal history of nicotine dependence: Secondary | ICD-10-CM | POA: Diagnosis not present

## 2015-05-24 DIAGNOSIS — Z79899 Other long term (current) drug therapy: Secondary | ICD-10-CM | POA: Diagnosis not present

## 2015-05-24 MED ORDER — MELOXICAM 7.5 MG PO TABS: 7.5000 mg | ORAL_TABLET | Freq: Every day | ORAL | Status: DC

## 2015-05-24 MED ORDER — KETOROLAC TROMETHAMINE 60 MG/2ML IM SOLN
60.0000 mg | Freq: Once | INTRAMUSCULAR | Status: AC
  Administered 2015-05-24: 60 mg via INTRAMUSCULAR
  Filled 2015-05-24: qty 2

## 2015-05-24 NOTE — ED Notes (Signed)
Pt. Reports no back pain with the L hip and L leg pain.  Pt. Reports no trouble urinating.

## 2015-05-24 NOTE — ED Notes (Signed)
Left leg and hip pain x 2 weeks.

## 2015-05-24 NOTE — ED Provider Notes (Signed)
CSN: [id number]     Arrival date & time 05/24/15  1331 History   First MD Initiated Contact with Patient 05/24/15 1531     Chief Complaint  Patient presents with  . Leg Pain     (Consider location/radiation/quality/duration/timing/severity/associated sxs/prior Treatment) HPI  Pt presenting with c/o left hip pain.  She states pain began 2 weeks ago, began as a dull ache and has been worsening.  She has no associated back pain.  No weakness of leg.  No urinary symptoms. No new activities or injuries.  She was seen by chiropractor and had ultrasound treatment for hip bursitis- she states this did help but by the next morning the pain had recurred.  Pain is worse with movement and bearing weight.  There are no other associated systemic symptoms, there are no other alleviating or modifying factors.   Past Medical History  Diagnosis Date  . No pertinent past medical history    Past Surgical History  Procedure Laterality Date  . No past surgeries     Family History  Problem Relation Age of Onset  . Anesthesia problems Neg Hx    Social History  Substance Use Topics  . Smoking status: Former Smoker -- 13 years    Types: Cigarettes  . Smokeless tobacco: Former Neurosurgeon    Quit date: 09/04/2011  . Alcohol Use: No     Comment: last use of marijuana was 09-02-11 per pt. Last result here on 09-15-11 was positive for cocaine and Marijuana   OB History    Gravida Para Term Preterm AB TAB SAB Ectopic Multiple Living   Review of Systems  ROS reviewed and all otherwise negative except for mentioned in HPI    Allergies  Risperidone  Home Medications   Prior to Admission medications   Medication Sig Start Date End Date Taking? Authorizing Provider  cyclobenzaprine (FLEXERIL) 10 MG tablet Take 1 tablet (10 mg total) by mouth 2 (two) times daily as needed for muscle spasms. 04/01/15   Courteney Lyn Mackuen, MD  ibuprofen (ADVIL,MOTRIN) 800 MG tablet Take 1 tablet (800 mg  total) by mouth 3 (three) times daily. 04/01/15   Courteney Lyn Mackuen, MD  meloxicam (MOBIC) 7.5 MG tablet Take 1 tablet (7.5 mg total) by mouth daily. 05/24/15   Jerelyn Scott, MD  omeprazole (PRILOSEC OTC) 20 MG tablet Take 1 tablet (20 mg total) by mouth daily. 10/08/11 10/07/12  Reva Bores, MD  Prenatal Vit-Fe Fumarate-FA (PRENATAL MULTIVITAMIN) TABS Take 1 tablet by mouth daily.    Historical Provider, MD   BP 122/74 mmHg  Pulse 73  Temp(Src) 98.2 F (36.8 C) (Oral)  Resp 20  Ht  (1.702 m)  Wt 189 lb (85.73 kg)  BMI 29.59 kg/m2  SpO2 100%  Vitals reviewed Physical Exam  Physical Examination: General appearance - alert, well appearing, and in no distress Mental status - alert, oriented to person, place, and time Eyes - no conjunctival injection, no scleral icterus Neurological - alert, oriented, normal speech, strength 5/5 in lower extremities bilaterally Musculoskeletal - ttp over posterior/inferior left hip joint, some pain with ROM, otherwise  no joint tenderness, deformity or swelling Extremities - peripheral pulses normal, no pedal edema, no clubbing or cyanosis,  Skin - normal coloration and turgor, no rashes  ED Course  Procedures (including critical care time) Labs Review Labs Reviewed - No data to display  Imaging Review Dg Hip Unilat  With Pelvis 2-3 Views Left  05/24/2015  CLINICAL DATA:  Left hip pain for 2 weeks. EXAM: DG HIP (WITH OR WITHOUT PELVIS) 2-3V LEFT COMPARISON:  None. FINDINGS: There is no evidence of hip fracture or dislocation. There is no evidence of arthropathy or other focal bone abnormality. IUD seen in the pelvis. Phleboliths are noted. IMPRESSION: No acute fracture or dislocation identified about the left hip. Electronically Signed   By: Ted Mcalpineobrinka  Dimitrova M.D.   On: 05/24/2015 16:07   I have personally reviewed and evaluated these images and lab results as part of my medical decision-making.   EKG Interpretation None      MDM    Final diagnoses:  Hip pain, left    Pt presenting with c/o left hip pain, tenderness to palpation over left posterior hip, no back pain, no weakness of legs or signs or symptoms of cauda equina.  Pt is recovering from substance addiction so requests no narcotics.  Xray obtained and shows no bony abnormality.  Pt given rx for meloxicam, also given referral information for orthopedics.  Discharged with strict return precautions.  Pt agreeable with plan.    Jerelyn ScottMartha Linker, MD 05/24/15 [phone]

## 2015-05-24 NOTE — Discharge Instructions (Signed)
Return to the ED with any concerns including increased pain, not able to bear weight, leg swelling, or any other alarming symptoms

## 2015-05-24 NOTE — ED Notes (Signed)
Patient reassessed, eating chips in waiting area, doesn't need anything at present time.

## 2016-01-23 ENCOUNTER — Emergency Department (HOSPITAL_BASED_OUTPATIENT_CLINIC_OR_DEPARTMENT_OTHER)
Admission: EM | Admit: 2016-01-23 | Discharge: 2016-01-23 | Disposition: A | Discharge status: Home / Self Care | Payer: Medicaid Other | Attending: Dermatology | Admitting: Dermatology

## 2016-01-23 ENCOUNTER — Encounter (HOSPITAL_BASED_OUTPATIENT_CLINIC_OR_DEPARTMENT_OTHER): Payer: Self-pay

## 2016-01-23 DIAGNOSIS — R200 Anesthesia of skin: Secondary | ICD-10-CM | POA: Insufficient documentation

## 2016-01-23 DIAGNOSIS — Z87891 Personal history of nicotine dependence: Secondary | ICD-10-CM | POA: Diagnosis not present

## 2016-01-23 DIAGNOSIS — Z5321 Procedure and treatment not carried out due to patient leaving prior to being seen by health care provider: Secondary | ICD-10-CM | POA: Diagnosis not present

## 2016-01-23 HISTORY — DX: Cocaine abuse, in remission: F14.11

## 2016-01-23 NOTE — ED Notes (Addendum)
Pt stated that she wanted to leave,  Had young child that was restless,   Pt was informed that we were ready to place her in a room, but she was afraid that son would disturbed other Pt stated that complaint had been going on for 1 month,  She would call her md in am,  If unable to get appoint she would come back here  Pt left after triage
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
## 2016-02-24 ENCOUNTER — Encounter (HOSPITAL_BASED_OUTPATIENT_CLINIC_OR_DEPARTMENT_OTHER): Payer: Self-pay

## 2016-02-24 ENCOUNTER — Emergency Department (HOSPITAL_BASED_OUTPATIENT_CLINIC_OR_DEPARTMENT_OTHER)
Admission: EM | Admit: 2016-02-24 | Discharge: 2016-02-24 | Disposition: A | Discharge status: Home / Self Care | Payer: Medicaid Other | Attending: Emergency Medicine | Admitting: Emergency Medicine

## 2016-02-24 DIAGNOSIS — Z87891 Personal history of nicotine dependence: Secondary | ICD-10-CM | POA: Insufficient documentation

## 2016-02-24 DIAGNOSIS — J029 Acute pharyngitis, unspecified: Secondary | ICD-10-CM | POA: Diagnosis present

## 2016-02-24 DIAGNOSIS — H9201 Otalgia, right ear: Secondary | ICD-10-CM | POA: Diagnosis not present

## 2016-02-24 DIAGNOSIS — J020 Streptococcal pharyngitis: Secondary | ICD-10-CM | POA: Insufficient documentation

## 2016-02-24 LAB — RAPID STREP SCREEN (MED CTR MEBANE ONLY): STREPTOCOCCUS, GROUP A SCREEN (DIRECT): POSITIVE — AB

## 2016-02-24 MED ORDER — PENICILLIN G BENZATHINE 1200000 UNIT/2ML IM SUSP
1.2000 10*6.[IU] | Freq: Once | INTRAMUSCULAR | Status: AC
  Administered 2016-02-24: 1.2 10*6.[IU] via INTRAMUSCULAR
  Filled 2016-02-24: qty 2

## 2016-02-24 MED ORDER — DEXAMETHASONE SODIUM PHOSPHATE 10 MG/ML IJ SOLN
10.0000 mg | Freq: Once | INTRAMUSCULAR | Status: AC
  Administered 2016-02-24: 10 mg via INTRAMUSCULAR
  Filled 2016-02-24: qty 1

## 2016-02-24 NOTE — ED Provider Notes (Signed)
[facility] Provider Note   CSN: [id number] Arrival date & time: 02/24/16  1027     History   Chief Complaint Chief Complaint  Patient presents with  . Sore Throat    HPI [name] [name] is a 35 y.o. female.  HPI [name] [name] is a 35 y.o. female who presents to ED with complaint of sore throat. States pain started about 5 days ago. Reports pain with swallowing. No difficulty swallowing. States "i feel like I am swallowing glass." Reports some pain in the right ear. No fever or chills. No neck pain or stiffness. No nasal congestion. No cough. No one around her sick. She has been taking ibuprofen and doing salt water gargles with no relief of her symptoms. She states nothing she has tried has made herself throw better. Pain is sharp.  Past Medical History:  Diagnosis Date  . Cocaine abuse in remission   . No pertinent past medical history     Patient Active Problem List   Diagnosis Date Noted  . Vaginal delivery 10/20/2011    Past Surgical History:  Procedure Laterality Date  . NO PAST SURGERIES      OB History    Gravida Para Term Preterm AB Living   2 2 2     2    SAB TAB Ectopic Multiple Live Births           2       Home Medications    Prior to Admission medications   Medication Sig Start Date End Date Taking? Authorizing Provider  cyclobenzaprine (FLEXERIL) 10 MG tablet Take 1 tablet (10 mg total) by mouth 2 (two) times daily as needed for muscle spasms. 04/01/15   Courteney Lyn Mackuen, MD  ibuprofen (ADVIL,MOTRIN) 800 MG tablet Take 1 tablet (800 mg total) by mouth 3 (three) times daily. 04/01/15   Courteney Lyn Mackuen, MD  meloxicam (MOBIC) 7.5 MG tablet Take 1 tablet (7.5 mg total) by mouth daily. 05/24/15   Jerelyn ScottMartha Linker, MD  omeprazole (PRILOSEC OTC) 20 MG tablet Take 1 tablet (20 mg total) by mouth daily. 10/08/11 10/07/12  Reva Boresanya S Pratt, MD  Prenatal Vit-Fe Fumarate-FA (PRENATAL MULTIVITAMIN) TABS Take 1 tablet by mouth daily.    Historical  Provider, MD    Family History Family History  Problem Relation Age of Onset  . Anesthesia problems Neg Hx     Social History Social History  Substance Use Topics  . Smoking status: Former Smoker    Years: 13.00  . Smokeless tobacco: Former NeurosurgeonUser    Quit date: 09/04/2011  . Alcohol use Yes     Comment: weekly     Allergies   Risperidone   Review of Systems Review of Systems  Constitutional: Negative for chills and fever.  HENT: Positive for ear pain and sore throat. Negative for dental problem, trouble swallowing and voice change.   Respiratory: Negative for cough, chest tightness and shortness of breath.   Cardiovascular: Negative for chest pain, palpitations and leg swelling.  Genitourinary: Negative for dysuria, flank pain and pelvic pain.  Musculoskeletal: Negative for arthralgias, myalgias, neck pain and neck stiffness.  Skin: Negative for rash.  Neurological: Negative for dizziness, weakness and headaches.  All other systems reviewed and are negative.    Physical Exam Updated Vital Signs BP 141/80 (BP Location: Right Arm)   Pulse 78   Temp 98.8 F (37.1 C) (Oral)   Resp 18   Ht 5\' 7"  (1.702 m)   Wt 81.6 kg  SpO2 100%   BMI 28.19 kg/m   Physical Exam  Constitutional: She appears well-developed and well-nourished. No distress.  HENT:  Head: Normocephalic.  Normal nose. Normal outer ear, ear canal, TMs bilaterally. Oropharynx erythematous, tonsils bilaterally enlarged, with white exudate. Uvula is midline. No trismus.  Eyes: Conjunctivae are normal.  Neck: Neck supple.  Cardiovascular: Normal rate, regular rhythm and normal heart sounds.   Pulmonary/Chest: Effort normal and breath sounds normal. No respiratory distress. She has no wheezes. She has no rales.  Musculoskeletal: She exhibits no edema.  Neurological: She is alert.  Skin: Skin is warm and dry. Capillary refill takes less than 2 seconds.  Psychiatric: She has a normal mood and affect. Her  behavior is normal.  Nursing note and vitals reviewed.    ED Treatments / Results  Labs (all labs ordered are listed, but only abnormal results are displayed) Labs Reviewed  RAPID STREP SCREEN ([facility]) - Abnormal; Notable for the following:       Result Value   Streptococcus, Group A Screen (Direct) POSITIVE (*)    All other components within normal limits    EKG  EKG Interpretation None       Radiology No results found.  Procedures Procedures (including critical care time)  Medications Ordered in ED Medications  penicillin g benzathine (BICILLIN LA) 1200000 UNIT/2ML injection 1.2 Million Units (not administered)  dexamethasone (DECADRON) injection 10 mg (not administered)     Initial Impression / Assessment and Plan / ED Course  I have reviewed the triage vital signs and the nursing notes.  Pertinent labs & imaging results that were available during my care of the patient were reviewed by me and considered in my medical decision making (see chart for details).  Clinical Course   Patient in emergency department with sore throat for 5 days. Exam is concerning for strep. No evidence of peritonsillar retropharyngeal abscess, no voice change, no trismus, uvula is midline. Patient is in no acute distress. Strep screen obtained and is positive. Patient elected to be treated with insulin IM. Will also give Decadron IM for significant swelling of her tonsils. Home with ibuprofen, Tylenol, salt water gargles, follow up as needed.  Pt requested referral to ENT, stating she wants her tonsils out given frequent recurrent infections. Explained to her that follow up with ENT is elective. Will give whoever on call.   Vitals:   02/24/16 1039  BP: 141/80  Pulse: 78  Resp: 18  Temp: 98.8 F (37.1 C)  TempSrc: Oral  SpO2: 100%  Weight: 81.6 kg  Height: 5\' 7"  (1.702 m)     Final Clinical Impressions(s) / ED Diagnoses   Final diagnoses:  Strep pharyngitis    New  Prescriptions New Prescriptions   No medications on file     Jaynie Crumbleatyana Tniya Bowditch, PA-C 02/24/16 1147    Doug SouSam Jacubowitz, MD 02/24/16 1612

## 2016-02-24 NOTE — Discharge Instructions (Signed)
Ibuprofen and tylenol for pain. Continue salt water gargles. You were treated today for strep infection. Your symptoms should start improving by tomorrow. Follow up with primary care doctor.

## 2016-02-24 NOTE — ED Triage Notes (Signed)
Pt reports sore throat x 5 days. Also reports right ear pain and congestion. Reports painful swallowing. No difficulty breathing or swallowing.

## 2016-12-22 ENCOUNTER — Emergency Department (HOSPITAL_BASED_OUTPATIENT_CLINIC_OR_DEPARTMENT_OTHER)
Admission: EM | Admit: 2016-12-22 | Discharge: 2016-12-22 | Discharge status: Absent without leave | Payer: Medicaid Other

## 2018-07-10 ENCOUNTER — Emergency Department (HOSPITAL_COMMUNITY): Payer: Medicaid Other

## 2018-07-10 ENCOUNTER — Other Ambulatory Visit: Payer: Self-pay

## 2018-07-10 ENCOUNTER — Encounter (HOSPITAL_COMMUNITY): Payer: Self-pay | Admitting: Emergency Medicine

## 2018-07-10 ENCOUNTER — Emergency Department (HOSPITAL_COMMUNITY)
Admission: EM | Admit: 2018-07-10 | Discharge: 2018-07-10 | Disposition: A | Discharge status: Home / Self Care | Payer: Medicaid Other | Attending: Emergency Medicine | Admitting: Emergency Medicine

## 2018-07-10 DIAGNOSIS — Y92410 Unspecified street and highway as the place of occurrence of the external cause: Secondary | ICD-10-CM | POA: Diagnosis not present

## 2018-07-10 DIAGNOSIS — Y9301 Activity, walking, marching and hiking: Secondary | ICD-10-CM | POA: Diagnosis not present

## 2018-07-10 DIAGNOSIS — Y998 Other external cause status: Secondary | ICD-10-CM | POA: Diagnosis not present

## 2018-07-10 DIAGNOSIS — S82812A Torus fracture of upper end of left fibula, initial encounter for closed fracture: Secondary | ICD-10-CM | POA: Diagnosis not present

## 2018-07-10 DIAGNOSIS — S8992XA Unspecified injury of left lower leg, initial encounter: Secondary | ICD-10-CM | POA: Diagnosis present

## 2018-07-10 LAB — COMPREHENSIVE METABOLIC PANEL
ALBUMIN: 4 g/dL (ref 3.5–5.0)
ALT: 31 U/L (ref 0–44)
AST: 20 U/L (ref 15–41)
Alkaline Phosphatase: 71 U/L (ref 38–126)
Anion gap: 10 (ref 5–15)
BILIRUBIN TOTAL: 1 mg/dL (ref 0.3–1.2)
BUN: 9 mg/dL (ref 6–20)
CO2: 23 mmol/L (ref 22–32)
Calcium: 9.1 mg/dL (ref 8.9–10.3)
Chloride: 105 mmol/L (ref 98–111)
Creatinine, Ser: 0.7 mg/dL (ref 0.44–1.00)
GFR calc Af Amer: >60 mL/min (ref >60)
GFR calc non Af Amer: >60 mL/min (ref >60)
GLUCOSE: 106 mg/dL — AB (ref 70–99)
Potassium: 3.3 mmol/L — ABNORMAL LOW (ref 3.5–5.1)
Sodium: 138 mmol/L (ref 135–145)
TOTAL PROTEIN: 7.5 g/dL (ref 6.5–8.1)

## 2018-07-10 LAB — CBC
HCT: 49.2 % — ABNORMAL HIGH (ref 36.0–46.0)
Hemoglobin: 16.2 g/dL — ABNORMAL HIGH (ref 12.0–15.0)
MCH: 32.6 pg (ref 26.0–34.0)
MCHC: 32.9 g/dL (ref 30.0–36.0)
MCV: 99 fL (ref 80.0–100.0)
Platelets: 148 10*3/uL — ABNORMAL LOW (ref 150–400)
RBC: 4.97 MIL/uL (ref 3.87–5.11)
RDW: 11.9 % (ref 11.5–15.5)
WBC: 7.1 10*3/uL (ref 4.0–10.5)
nRBC: 0 % (ref 0.0–0.2)

## 2018-07-10 LAB — I-STAT CHEM 8, ED
BUN: 10 mg/dL (ref 6–20)
CREATININE: 0.6 mg/dL (ref 0.44–1.00)
Calcium, Ion: 1.11 mmol/L — ABNORMAL LOW (ref 1.15–1.40)
Chloride: 105 mmol/L (ref 98–111)
Glucose, Bld: 104 mg/dL — ABNORMAL HIGH (ref 70–99)
HCT: 50 % — ABNORMAL HIGH (ref 36.0–46.0)
Hemoglobin: 17 g/dL — ABNORMAL HIGH (ref 12.0–15.0)
Potassium: 3.4 mmol/L — ABNORMAL LOW (ref 3.5–5.1)
Sodium: 139 mmol/L (ref 135–145)
TCO2: 24 mmol/L (ref 22–32)

## 2018-07-10 LAB — SAMPLE TO BLOOD BANK

## 2018-07-10 LAB — PROTIME-INR
INR: 1.06
Prothrombin Time: 13.7 seconds (ref 11.4–15.2)

## 2018-07-10 LAB — ETHANOL

## 2018-07-10 LAB — I-STAT CG4 LACTIC ACID, ED: LACTIC ACID, VENOUS: 1.29 mmol/L (ref 0.5–1.9)

## 2018-07-10 LAB — I-STAT BETA HCG BLOOD, ED (MC, WL, AP ONLY): I-stat hCG, quantitative: <5 m[IU]/mL (ref <5)

## 2018-07-10 MED ORDER — ACETAMINOPHEN-CODEINE 120-12 MG/5ML PO SUSP: 5.0000 mL | Freq: Four times a day (QID) | ORAL | 0 refills | Status: DC | PRN

## 2018-07-10 MED ORDER — ONDANSETRON HCL 4 MG/2ML IJ SOLN
INTRAMUSCULAR | Status: AC
  Administered 2018-07-10: 23:00:00
  Filled 2018-07-10: qty 2

## 2018-07-10 MED ORDER — METAXALONE 800 MG PO TABS: 800.0000 mg | ORAL_TABLET | Freq: Three times a day (TID) | ORAL | 0 refills | Status: DC

## 2018-07-10 MED ORDER — SODIUM CHLORIDE 0.9 % IV BOLUS
500.0000 mL | Freq: Once | INTRAVENOUS | Status: AC
  Administered 2018-07-10: 500 mL via INTRAVENOUS

## 2018-07-10 MED ORDER — IOHEXOL 300 MG/ML  SOLN
100.0000 mL | Freq: Once | INTRAMUSCULAR | Status: AC | PRN
  Administered 2018-07-10: 100 mL via INTRAVENOUS
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
## 2018-07-10 NOTE — ED Notes (Signed)
E-signature not available, pt verbalized understanding of DC instructions and prescriptions 
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
## 2018-07-10 NOTE — ED Notes (Signed)
Pt had episode of emesis, approx . Pt given 4mg  Zofran IV.

## 2018-07-10 NOTE — ED Notes (Signed)
This tech requested to draw another gold top for blood work but pt refused. Stated unless it was drawn from the IV site I could not draw blood. RN Delorise Jackson informed

## 2018-07-10 NOTE — Progress Notes (Signed)
Orthopedic Tech Progress Note Patient Details:  [name] [name] 1980/12/07 [id number]  PATIENT REFUSED TO PAY ATTENTION WHILE I WAS SHOWING AND TEACHING HER HOW TO USE THE CRUTHCHES      Ortho Devices Type of Ortho Device: Crutches, Knee Immobilizer Ortho Device/Splint Location: LLE Ortho Device/Splint Interventions: Adjustment, Application, Ordered   Post Interventions Patient Tolerated: Fair Instructions Provided: Care of device, Adjustment of device   Donald PoreSade L Cherish Runde 07/10/2018, 11:36 PM
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
## 2018-07-11 ENCOUNTER — Encounter (HOSPITAL_BASED_OUTPATIENT_CLINIC_OR_DEPARTMENT_OTHER): Payer: Self-pay

## 2018-07-13 LAB — CDS SEROLOGY
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
## 2020-10-15 ENCOUNTER — Other Ambulatory Visit: Payer: Self-pay

## 2020-10-15 ENCOUNTER — Emergency Department (HOSPITAL_BASED_OUTPATIENT_CLINIC_OR_DEPARTMENT_OTHER)
Admission: EM | Admit: 2020-10-15 | Discharge: 2020-10-15 | Disposition: A | Discharge status: Home / Self Care | Payer: Medicaid Other | Attending: Emergency Medicine | Admitting: Emergency Medicine

## 2020-10-15 ENCOUNTER — Emergency Department (HOSPITAL_BASED_OUTPATIENT_CLINIC_OR_DEPARTMENT_OTHER): Payer: Medicaid Other

## 2020-10-15 ENCOUNTER — Encounter (HOSPITAL_BASED_OUTPATIENT_CLINIC_OR_DEPARTMENT_OTHER): Payer: Self-pay | Admitting: *Deleted

## 2020-10-15 DIAGNOSIS — J40 Bronchitis, not specified as acute or chronic: Secondary | ICD-10-CM | POA: Diagnosis not present

## 2020-10-15 DIAGNOSIS — F17210 Nicotine dependence, cigarettes, uncomplicated: Secondary | ICD-10-CM | POA: Insufficient documentation

## 2020-10-15 DIAGNOSIS — Z20822 Contact with and (suspected) exposure to covid-19: Secondary | ICD-10-CM | POA: Diagnosis not present

## 2020-10-15 DIAGNOSIS — R059 Cough, unspecified: Secondary | ICD-10-CM | POA: Diagnosis present

## 2020-10-15 MED ORDER — DEXAMETHASONE 6 MG PO TABS
10.0000 mg | ORAL_TABLET | Freq: Once | ORAL | Status: AC
  Administered 2020-10-15: 10 mg via ORAL
  Filled 2020-10-15: qty 1

## 2020-10-15 MED ORDER — ALBUTEROL SULFATE HFA 108 (90 BASE) MCG/ACT IN AERS
2.0000 | INHALATION_SPRAY | Freq: Once | RESPIRATORY_TRACT | Status: AC
  Administered 2020-10-15: 2 via RESPIRATORY_TRACT
  Filled 2020-10-15: qty 6.7

## 2020-10-15 NOTE — ED Notes (Signed)
COVID SWAB OBTAINED AND TO THE LAB 

## 2020-10-15 NOTE — ED Triage Notes (Signed)
C/o cough and loss of voice x 1 week

## 2020-10-15 NOTE — ED Provider Notes (Signed)
[facility] EMERGENCY DEPARTMENT Provider Note   CSN: [id number] Arrival date & time: 10/15/20  1643     History Chief Complaint  Patient presents with  . Cough    [name] [name] is a 40 y.o. female.  The history is provided by the patient.  Cough Cough characteristics:  Non-productive Sputum characteristics:  Nondescript Severity:  Mild Onset quality:  Gradual Timing:  Constant Progression:  Unchanged Chronicity:  New Smoker: yes   Context: not upper respiratory infection   Relieved by:  Nothing Worsened by:  Nothing Associated symptoms: sinus congestion   Associated symptoms: no chest pain, no chills, no diaphoresis, no ear fullness, no ear pain, no eye discharge, no fever, no myalgias, no rash, no shortness of breath and no sore throat        Past Medical History:  Diagnosis Date  . Cocaine abuse in remission (HCC)   . No pertinent past medical history     Patient Active Problem List   Diagnosis Date Noted  . Vaginal delivery 10/20/2011    Past Surgical History:  Procedure Laterality Date  . NO PAST SURGERIES       OB History    Gravida  2   Para  2   Term  2   Preterm  0   AB  0   Living  2     SAB  0   IAB  0   Ectopic  0   Multiple      Live Births  2           Family History  Problem Relation Age of Onset  . Anesthesia problems Neg Hx     Social History   Tobacco Use  . Smoking status: Current Every Day Smoker    Packs/day: 0.25    Types: Cigarettes  . Smokeless tobacco: Never Used  Substance Use Topics  . Alcohol use: Yes    Comment: weekly  . Drug use: Yes    Types: Cocaine, Marijuana    Home Medications Prior to Admission medications   Medication Sig Start Date End Date Taking? Authorizing Provider  acetaminophen-codeine 120-12 MG/5ML suspension Take 5 mLs by mouth every 6 (six) hours as needed for pain. 07/10/18   Lorre Nick, MD  cyclobenzaprine (FLEXERIL) 10 MG tablet Take 1 tablet (10 mg  total) by mouth 2 (two) times daily as needed for muscle spasms. 04/01/15   Mackuen, Courteney Lyn, MD  ibuprofen (ADVIL,MOTRIN) 200 MG tablet Take 800 mg by mouth every 6 (six) hours as needed for moderate pain.    [provider]  ibuprofen (ADVIL,MOTRIN) 800 MG tablet Take 1 tablet (800 mg total) by mouth 3 (three) times daily. 04/01/15   Mackuen, Courteney Lyn, MD  meloxicam (MOBIC) 7.5 MG tablet Take 1 tablet (7.5 mg total) by mouth daily. 05/24/15   Mabe, Latanya Maudlin, MD  metaxalone (SKELAXIN) 800 MG tablet Take 1 tablet (800 mg total) by mouth 3 (three) times daily. 07/10/18   Lorre Nick, MD  omeprazole (PRILOSEC OTC) 20 MG tablet Take 1 tablet (20 mg total) by mouth daily. 10/08/11 10/07/12  Reva Bores, MD  omeprazole (PRILOSEC) 20 MG capsule Take 20 mg by mouth daily as needed (heartburn).    [provider]  Prenatal Vit-Fe Fumarate-FA (PRENATAL MULTIVITAMIN) TABS Take 1 tablet by mouth daily.    [provider]    Allergies    Risperidone and Risperidone and related  Review of Systems   Review of  Systems  Constitutional: Negative for chills, diaphoresis and fever.  HENT: Negative for ear pain and sore throat.   Eyes: Negative for pain, discharge and visual disturbance.  Respiratory: Positive for cough. Negative for shortness of breath.   Cardiovascular: Negative for chest pain and palpitations.  Gastrointestinal: Negative for abdominal pain and vomiting.  Genitourinary: Negative for dysuria and hematuria.  Musculoskeletal: Negative for arthralgias, back pain and myalgias.  Skin: Negative for color change and rash.  Neurological: Negative for seizures and syncope.  All other systems reviewed and are negative.   Physical Exam Updated Vital Signs  ED Triage Vitals  Enc Vitals Group     BP 10/15/20 1659 (!) 135/92     Pulse Rate 10/15/20 1659 69     Resp 10/15/20 1659 20     Temp 10/15/20 1659 98.5 F (36.9 C)     Temp Source 10/15/20 1659 Oral      SpO2 10/15/20 1659 98 %     Weight 10/15/20 1658 189 lb (85.7 kg)     Height 10/15/20 1658 5\' 7"  (1.702 m)     Head Circumference --      Peak Flow --      Pain Score 10/15/20 1658 0     Pain Loc --      Pain Edu? --      Excl. in GC? --     Physical Exam Vitals and nursing note reviewed.  Constitutional:      General: She is not in acute distress.    Appearance: She is well-developed. She is not ill-appearing.  HENT:     Head: Normocephalic and atraumatic.     Nose: Nose normal.     Mouth/Throat:     Mouth: Mucous membranes are moist.  Eyes:     Extraocular Movements: Extraocular movements intact.     Conjunctiva/sclera: Conjunctivae normal.     Pupils: Pupils are equal, round, and reactive to light.  Cardiovascular:     Rate and Rhythm: Normal rate and regular rhythm.     Pulses: Normal pulses.     Heart sounds: Normal heart sounds. No murmur heard.   Pulmonary:     Effort: Pulmonary effort is normal. No respiratory distress.     Breath sounds: Normal breath sounds.  Abdominal:     Palpations: Abdomen is soft.     Tenderness: There is no abdominal tenderness.  Musculoskeletal:     Cervical back: Normal range of motion and neck supple.  Skin:    General: Skin is warm and dry.     Capillary Refill: Capillary refill takes less than 2 seconds.  Neurological:     General: No focal deficit present.     Mental Status: She is alert.  Psychiatric:        Mood and Affect: Mood normal.     ED Results / Procedures / Treatments   Labs (all labs ordered are listed, but only abnormal results are displayed) Labs Reviewed  SARS CORONAVIRUS 2 (TAT 6-24 HRS)    EKG None  Radiology DG Chest Portable 1 View  Result Date: 10/15/2020 CLINICAL DATA:  Cough. EXAM: PORTABLE CHEST 1 VIEW COMPARISON:  October 09, 2020. FINDINGS: The heart size and mediastinal contours are within normal limits. Both lungs are clear. The visualized skeletal structures are unremarkable. IMPRESSION: No  active disease. Electronically Signed   By: October 11, 2020 M.D.   On: 10/15/2020 18:49    Procedures Procedures   Medications Ordered in ED Medications  dexamethasone (DECADRON) tablet 10 mg (has no administration in time range)  albuterol (VENTOLIN HFA) 108 (90 Base) MCG/ACT inhaler 2 puff (has no administration in time range)    ED Course  I have reviewed the triage vital signs and the nursing notes.  Pertinent labs & imaging results that were available during my care of the patient were reviewed by me and considered in my medical decision making (see chart for details).    MDM Rules/Calculators/A&P                          [name] [name] is here with cough.  Nonproductive nondescript cough for the last several days.  Is a smoker.  Hoarse voice.  Suspect bronchitis versus laryngitis.  Will test for Covid.  Chest x-ray negative for infection.  Will give albuterol to help with symptomatic relief as could have some mild COPD.  Overall suspect reactive airway process versus allergies versus viral process.  Discharged in good condition.  Understands return precautions.  This chart was dictated using voice recognition software.  Despite best efforts to proofread,  errors can occur which can change the documentation meaning.    Final Clinical Impression(s) / ED Diagnoses Final diagnoses:  Bronchitis    Rx / DC Orders ED Discharge Orders    None       Virgina Norfolk, DO 10/15/20 1855

## 2020-10-15 NOTE — Discharge Instructions (Addendum)
Use inhaler as needed.  2 puffs every 4 hours to help with cough.

## 2020-10-16 LAB — SARS CORONAVIRUS 2 (TAT 6-24 HRS): SARS Coronavirus 2: NEGATIVE

## 2023-03-16 ENCOUNTER — Encounter (HOSPITAL_BASED_OUTPATIENT_CLINIC_OR_DEPARTMENT_OTHER): Payer: Self-pay | Admitting: Emergency Medicine

## 2023-03-16 ENCOUNTER — Other Ambulatory Visit: Payer: Self-pay

## 2023-03-16 ENCOUNTER — Emergency Department (HOSPITAL_BASED_OUTPATIENT_CLINIC_OR_DEPARTMENT_OTHER)
Admission: EM | Admit: 2023-03-16 | Discharge: 2023-03-16 | Disposition: A | Discharge status: Home / Self Care | Payer: MEDICAID | Attending: Emergency Medicine | Admitting: Emergency Medicine

## 2023-03-16 DIAGNOSIS — A599 Trichomoniasis, unspecified: Secondary | ICD-10-CM

## 2023-03-16 DIAGNOSIS — A5900 Urogenital trichomoniasis, unspecified: Secondary | ICD-10-CM | POA: Diagnosis not present

## 2023-03-16 DIAGNOSIS — N898 Other specified noninflammatory disorders of vagina: Secondary | ICD-10-CM | POA: Diagnosis present

## 2023-03-16 LAB — URINALYSIS, ROUTINE W REFLEX MICROSCOPIC
Bilirubin Urine: NEGATIVE
Glucose, UA: NEGATIVE mg/dL
Hgb urine dipstick: NEGATIVE
Ketones, ur: NEGATIVE mg/dL
Leukocytes,Ua: NEGATIVE
Nitrite: NEGATIVE
Protein, ur: NEGATIVE mg/dL
Specific Gravity, Urine: >=1.03 (ref 1.005–1.030)
pH: 6 (ref 5.0–8.0)

## 2023-03-16 LAB — PREGNANCY, URINE: Preg Test, Ur: NEGATIVE

## 2023-03-16 LAB — WET PREP, GENITAL
Clue Cells Wet Prep HPF POC: NONE SEEN
Sperm: NONE SEEN
WBC, Wet Prep HPF POC: <10 (ref <10)
Yeast Wet Prep HPF POC: NONE SEEN

## 2023-03-16 MED ORDER — ACETAMINOPHEN 500 MG PO TABS: 1000.0000 mg | ORAL_TABLET | Freq: Once | ORAL | Status: DC

## 2023-03-16 MED ORDER — METRONIDAZOLE 500 MG PO TABS: 500.0000 mg | ORAL_TABLET | Freq: Two times a day (BID) | ORAL | 0 refills | Status: DC

## 2023-03-16 NOTE — ED Provider Notes (Signed)
[place] EMERGENCY DEPARTMENT AT [facility] Provider Note   CSN: [id number] Arrival date & time: 03/16/23  3664     History  Chief Complaint  Patient presents with   Vaginal Discharge    [name] [name] is a 42 y.o. female.   Vaginal Discharge   42 year old female presents emergency department with complaints of vaginal discharge for the past 3 days.  States that she had a trichomonas infection around a week ago but only took half of the antibiotics prescribed.  States that symptoms returned over the past few days.  Reports slight amount of vaginal discharge as well as malodorous vaginal discharge.  Denies any fever, urinary symptoms, pain, change in bowel habits, nausea, vomiting.  States it feels similar to prior diagnosis of trichomonas.  Past medical history significant for substance use  Home Medications Prior to Admission medications   Medication Sig Start Date End Date Taking? Authorizing Provider  metroNIDAZOLE (FLAGYL) 500 MG tablet Take 1 tablet (500 mg total) by mouth 2 (two) times daily. 03/16/23  Yes Sherian Maroon A, PA  acetaminophen-codeine 120-12 MG/5ML suspension Take 5 mLs by mouth every 6 (six) hours as needed for pain. 07/10/18   Lorre Nick, MD  cyclobenzaprine (FLEXERIL) 10 MG tablet Take 1 tablet (10 mg total) by mouth 2 (two) times daily as needed for muscle spasms. 04/01/15   Mackuen, Courteney Lyn, MD  ibuprofen (ADVIL,MOTRIN) 200 MG tablet Take 800 mg by mouth every 6 (six) hours as needed for moderate pain.    [provider]  ibuprofen (ADVIL,MOTRIN) 800 MG tablet Take 1 tablet (800 mg total) by mouth 3 (three) times daily. 04/01/15   Mackuen, Courteney Lyn, MD  meloxicam (MOBIC) 7.5 MG tablet Take 1 tablet (7.5 mg total) by mouth daily. 05/24/15   Mabe, Latanya Maudlin, MD  metaxalone (SKELAXIN) 800 MG tablet Take 1 tablet (800 mg total) by mouth 3 (three) times daily. 07/10/18   Lorre Nick, MD  omeprazole (PRILOSEC OTC) 20 MG tablet  Take 1 tablet (20 mg total) by mouth daily. 10/08/11 10/07/12  Reva Bores, MD  omeprazole (PRILOSEC) 20 MG capsule Take 20 mg by mouth daily as needed (heartburn).    [provider]  Prenatal Vit-Fe Fumarate-FA (PRENATAL MULTIVITAMIN) TABS Take 1 tablet by mouth daily.    [provider]      Allergies    Risperidone and Risperidone and related    Review of Systems   Review of Systems  Genitourinary:  Positive for vaginal discharge.  All other systems reviewed and are negative.   Physical Exam Updated Vital Signs BP 110/74   Pulse 69   Temp 98.2 F (36.8 C)   Resp 18   Wt 77.1 kg   LMP  (Exact Date)   SpO2 98%   BMI 26.63 kg/m  Physical Exam Vitals and nursing note reviewed.  Constitutional:      General: She is not in acute distress.    Appearance: She is well-developed.  HENT:     Head: Normocephalic and atraumatic.  Eyes:     Conjunctiva/sclera: Conjunctivae normal.  Cardiovascular:     Rate and Rhythm: Normal rate and regular rhythm.     Heart sounds: No murmur heard. Pulmonary:     Effort: Pulmonary effort is normal. No respiratory distress.     Breath sounds: Normal breath sounds.  Abdominal:     Palpations: Abdomen is soft.     Tenderness: There is no abdominal tenderness.  Musculoskeletal:  General: No swelling.     Cervical back: Neck supple.  Skin:    General: Skin is warm and dry.     Capillary Refill: Capillary refill takes less than 2 seconds.  Neurological:     Mental Status: She is alert.  Psychiatric:        Mood and Affect: Mood normal.     ED Results / Procedures / Treatments   Labs (all labs ordered are listed, but only abnormal results are displayed) Labs Reviewed  WET PREP, GENITAL - Abnormal; Notable for the following components:      Result Value   Trich, Wet Prep PRESENT (*)    All other components within normal limits  URINALYSIS, ROUTINE W REFLEX MICROSCOPIC  PREGNANCY, URINE  GC/CHLAMYDIA PROBE AMP  ([place]) NOT AT [facility]    EKG None  Radiology No results found.  Procedures Procedures    Medications Ordered in ED Medications - No data to display  ED Course/ Medical Decision Making/ A&P Clinical Course as of 03/16/23 1022  Tue Mar 16, 2023  0945 Patient states that she has STD testing at [facility] which is pending at this time and is only requesting testing for BV/trichomonas and yeast.  Offered patient pelvic exam with obtaining swabs but she declined in favor of self swabs. [CR]    Clinical Course User Index [CR] Peter Garter, PA                                 Medical Decision Making  This patient presents to the ED for concern of vaginal charge, this involves an extensive number of treatment options, and is a complaint that carries with it a high risk of complications and morbidity.  The differential diagnosis includes BV, trichomonas, gonorrhea/chlamydia, STD/STI   Co morbidities that complicate the patient evaluation  See HPI   Additional history obtained:  Additional history obtained from EMR External records from outside source obtained and reviewed including hospital records   Lab Tests:  I Ordered, and personally interpreted labs.  The pertinent results include: Wet prep positive for trichomoniasis.  UA negative.  Urine pregnancy negative.  GC/committee pending   Imaging Studies ordered:  N/a   Cardiac Monitoring: / EKG:  The patient was maintained on a cardiac monitor.  I personally viewed and interpreted the cardiac monitored which showed an underlying rhythm of: Sinus rhythm   Consultations Obtained:  N/a   Problem List / ED Course / Critical interventions / Medication management  Vaginal discharge Reevaluation of the patient showed that the patient stayed the same I have reviewed the patients home medicines and have made adjustments as needed   Social Determinants of Health:  Chronic cigarette use.  History of substance  use.   Test / Admission - Considered:  Vaginal discharge Vitals signs within normal range and stable throughout visit. Laboratory studies significant for: See above 46 year old presents emergency department with complaints of malodorous vaginal discharge for the past few days.  Patient reports history of trichomonas infection without complete compliance to Flagyl in the outpatient setting.  States that she stopped taking medication after 3 days or so.  Patient inclined pelvic exam but did perform self swabs.  Swabs positive for trichomonas.  Will treat with Flagyl in the outpatient setting and have her follow-up with primary care.  STD testing pending at this time in the outpatient setting and will recommend follow-up with primary care  provider if testing positive for seeking additional treatment.  Treatment plan discussed at length with patient and she acknowledged understanding was agreeable to said plan.  Patient overall well-appearing, afebrile in no acute distress. Worrisome signs and symptoms were discussed with the patient, and the patient acknowledged understanding to return to the ED if noticed. Patient was stable upon discharge.          Final Clinical Impression(s) / ED Diagnoses Final diagnoses:  Vaginal discharge  Trichomoniasis    Rx / DC Orders ED Discharge Orders          Ordered    metroNIDAZOLE (FLAGYL) 500 MG tablet  2 times daily        03/16/23 1020              Peter Garter, [place] 03/16/23 1022    Virgina Norfolk, DO 03/16/23 1108

## 2023-03-16 NOTE — Discharge Instructions (Addendum)
As discussed, you tested positive for trichomonas.  Will treat this with Flagyl in the outpatient setting.  Please take full course of antibiotics.  Recommend follow-up with primary care for reassessment of your symptoms.  Please do not hesitate to return to emergency department for worrisome signs and symptoms we discussed become apparent.

## 2023-03-16 NOTE — ED Triage Notes (Signed)
Recurrent vaginal discharge and fool odor . Reports was on Flagyl last mont but took on 4 doses . Pt is at [facility] currently . Denies urinary symptoms

## 2023-03-17 LAB — GC/CHLAMYDIA PROBE AMP (~~LOC~~) NOT AT ARMC
Chlamydia: NEGATIVE
Comment: NEGATIVE
Comment: NORMAL
Neisseria Gonorrhea: NEGATIVE

## 2023-03-22 ENCOUNTER — Encounter: Payer: Self-pay | Admitting: Physician Assistant

## 2023-03-22 ENCOUNTER — Ambulatory Visit: Payer: MEDICAID | Admitting: Physician Assistant

## 2023-03-22 VITALS — BP 115/60 | HR 60 | Ht 66.0 in | Wt 174.0 lb

## 2023-03-22 DIAGNOSIS — F1410 Cocaine abuse, uncomplicated: Secondary | ICD-10-CM

## 2023-03-22 DIAGNOSIS — K0889 Other specified disorders of teeth and supporting structures: Secondary | ICD-10-CM | POA: Diagnosis not present

## 2023-03-22 DIAGNOSIS — F411 Generalized anxiety disorder: Secondary | ICD-10-CM | POA: Diagnosis not present

## 2023-03-22 DIAGNOSIS — F3170 Bipolar disorder, currently in remission, most recent episode unspecified: Secondary | ICD-10-CM | POA: Diagnosis not present

## 2023-03-22 MED ORDER — PENICILLIN V POTASSIUM 500 MG PO TABS: 500.0000 mg | ORAL_TABLET | Freq: Three times a day (TID) | ORAL | 0 refills | Status: AC

## 2023-03-22 NOTE — Progress Notes (Unsigned)
New Patient Office Visit  Subjective    Patient ID: [name] [name], female    DOB: [birth year]  Age: 42 y.o. MRN: [id number]  CC:  Chief Complaint  Patient presents with   Dental Pain    HPI [name] [name] states that she is currently being treated for substance abuse at [facility] recovery center.  States that she arrived two weeks ago.  States that she has been experiencing dental pain, swelling and foul taste on the left bottom "canine tooth" states that this has previously broken and been repaired, however part of the repair has broken off.  States that this started approximately 3 weeks ago.  States that she has not tried anything for relief.   Outpatient Encounter Medications as of 03/22/2023  Medication Sig   penicillin v potassium (VEETID) 500 MG tablet Take 1 tablet (500 mg total) by mouth 3 (three) times daily for 10 days.   escitalopram (LEXAPRO) 20 MG tablet Take 1 tablet by mouth daily.   metroNIDAZOLE (FLAGYL) 500 MG tablet Take 1 tablet (500 mg total) by mouth 2 (two) times daily.   omeprazole (PRILOSEC) 20 MG capsule Take 20 mg by mouth daily as needed (heartburn).   [DISCONTINUED] acetaminophen-codeine 120-12 MG/5ML suspension Take 5 mLs by mouth every 6 (six) hours as needed for pain.   [DISCONTINUED] cyclobenzaprine (FLEXERIL) 10 MG tablet Take 1 tablet (10 mg total) by mouth 2 (two) times daily as needed for muscle spasms.   [DISCONTINUED] ibuprofen (ADVIL,MOTRIN) 200 MG tablet Take 800 mg by mouth every 6 (six) hours as needed for moderate pain.   [DISCONTINUED] ibuprofen (ADVIL,MOTRIN) 800 MG tablet Take 1 tablet (800 mg total) by mouth 3 (three) times daily.   [DISCONTINUED] meloxicam (MOBIC) 7.5 MG tablet Take 1 tablet (7.5 mg total) by mouth daily.   [DISCONTINUED] metaxalone (SKELAXIN) 800 MG tablet Take 1 tablet (800 mg total) by mouth 3 (three) times daily.   [DISCONTINUED] omeprazole (PRILOSEC OTC) 20 MG tablet Take 1 tablet (20 mg total) by mouth daily.    [DISCONTINUED] Prenatal Vit-Fe Fumarate-FA (PRENATAL MULTIVITAMIN) TABS Take 1 tablet by mouth daily.   No facility-administered encounter medications on file as of 03/22/2023.    Past Medical History:  Diagnosis Date   Cocaine abuse in remission (HCC)    No pertinent past medical history     Past Surgical History:  Procedure Laterality Date   NO PAST SURGERIES      Family History  Problem Relation Age of Onset   Anesthesia problems Neg Hx     Social History   Socioeconomic History   Marital status: Single    Spouse name: Not on file   Number of children: Not on file   Years of education: Not on file   Highest education level: Not on file  Occupational History   Not on file  Tobacco Use   Smoking status: Former    Current packs/day: 0.25    Types: Cigarettes   Smokeless tobacco: Never  Substance and Sexual Activity   Alcohol use: Yes    Comment: weekly   Drug use: Yes    Types: Cocaine, Marijuana   Sexual activity: Never    Birth control/protection: I.U.D.  Other Topics Concern   Not on file  Social History Narrative   ** Merged History Encounter **       Social Determinants of Health   Financial Resource Strain: Not on file  Food Insecurity: Not on file  Transportation Needs: Not on file  Physical Activity: Not on file  Stress: Not on file  Social Connections: Not on file  Intimate Partner Violence: Not on file    Review of Systems  Constitutional:  Negative for chills and fever.  HENT: Negative.    Eyes: Negative.   Respiratory:  Negative for shortness of breath.   Cardiovascular:  Negative for chest pain.  Gastrointestinal: Negative.   Genitourinary: Negative.   Musculoskeletal: Negative.   Skin: Negative.   Neurological: Negative.   Endo/Heme/Allergies: Negative.   Psychiatric/Behavioral: Negative.          Objective    BP 115/60 (BP Location: Left Arm, Patient Position: Sitting, Cuff Size: Normal)   Pulse 60   Ht 5\' 6"  (1.676 m)    Wt 174 lb (78.9 kg)   LMP 03/18/2023 (Exact Date)   SpO2 96%   BMI 28.08 kg/m   Physical Exam Vitals and nursing note reviewed.  Constitutional:      Appearance: Normal appearance.  HENT:     Head: Normocephalic and atraumatic.     Right Ear: External ear normal.     Left Ear: External ear normal.     Nose: Nose normal.     Mouth/Throat:     Lips: Pink.     Mouth: Mucous membranes are moist.     Dentition: Abnormal dentition. Dental tenderness, gingival swelling and dental abscesses present.  Eyes:     Extraocular Movements: Extraocular movements intact.     Conjunctiva/sclera: Conjunctivae normal.     Pupils: Pupils are equal, round, and reactive to light.  Cardiovascular:     Rate and Rhythm: Normal rate and regular rhythm.     Pulses: Normal pulses.     Heart sounds: Normal heart sounds.  Pulmonary:     Effort: Pulmonary effort is normal.     Breath sounds: Normal breath sounds.  Musculoskeletal:        General: Normal range of motion.     Cervical back: Normal range of motion and neck supple.  Skin:    General: Skin is warm and dry.  Neurological:     General: No focal deficit present.     Mental Status: She is alert and oriented to person, place, and time.  Psychiatric:        Mood and Affect: Mood normal.        Behavior: Behavior normal.        Thought Content: Thought content normal.        Judgment: Judgment normal.        Assessment & Plan:   Problem List Items Addressed This Visit       Other   Cocaine abuse (HCC) - Primary   Bipolar affective disorder in remission (HCC)   GAD (generalized anxiety disorder)   Relevant Medications   escitalopram (LEXAPRO) 20 MG tablet   Other Visit Diagnoses     Pain, dental       Relevant Medications   penicillin v potassium (VEETID) 500 MG tablet      1. Pain, dental Trial penicillin, patient education given on supportive care, encouraged to follow-up with dentistry as soon as able.  Red flags given for  prompt reevaluation. - penicillin v potassium (VEETID) 500 MG tablet; Take 1 tablet (500 mg total) by mouth 3 (three) times daily for 10 days.  Dispense: 30 tablet; Refill: 0  2. Bipolar affective disorder in remission [facility]) Managed by [facility] psychiatry  3. GAD (generalized anxiety disorder) Managed by [facility] psychiatry  4.  Cocaine abuse (HCC) Currently in substance abuse treatment program   I have reviewed the patient's medical history (PMH, PSH, Social History, Family History, Medications, and allergies) , and have been updated if relevant. I spent 30 minutes reviewing chart and  face to face time with patient.    Return if symptoms worsen or fail to improve.   Kasandra Knudsen Mayers, PA-C

## 2023-03-22 NOTE — Patient Instructions (Addendum)
You are going to take penicillin 3 times a day for 10 days.  I encourage you to follow-up with dentistry as soon as you are able.  I hope that you feel better soon, please let us know if there is anything else we can do for you.  Roney Jaffe, PA-C Physician Assistant [facility] [email or web address] Dental Pain Dental pain is often a sign that something is wrong with your teeth or gums. It is also something that can occur following dental treatment. If you have dental pain, it is important to contact your dental care provider, especially if the cause of the pain has not been determined. Dental pain may be of varying intensity and can be caused by many things, including: Tooth decay (cavities or caries). Cavities are caused by bacteria that produce acids that irritate the nerve of your tooth, making it sensitive to air and hot or cold temperatures. This eventually causes discomfort or pain. Abscess or infection. Once the bacteria reach the inner part of the tooth (pulp), a bacterial infection (dental abscess) can occur. Pus typically collects at the end of the root of a tooth. Injury. A crack in the tooth. Gum recession exposing the root, and possibly the nerves, of a tooth. Gum (periodontal)disease. Abnormal grinding or clenching. Poor or improper home care. An unknown reason (idiopathic). Your pain may be mild or severe. It may occur when you are: Chewing. Exposed to hot or cold temperatures. Eating or drinking sugary foods or beverages, such as soda or candy. Your pain may be constant, or it may come and go without cause. Follow these instructions at home: The following actions may help to lessen any discomfort that you are feeling before or after getting dental care. Medicines Take over-the-counter and prescription medicines only as told by your dental care provider. If you were prescribed an antibiotic medicine, take it as told  by your dental care provider. Do not stop taking the antibiotic even if you start to feel better. Eating and drinking Avoid foods or drinks that cause you pain, such as: Very hot or very cold foods or drinks. Sweet or sugary foods or drinks. Managing pain and swelling  Ice can sometimes be used to reduce pain and swelling, especially if the pain is following dental treatment. If directed, put ice on the painful area of your face. To do this: Put ice in a plastic bag. Place a towel between your skin and the bag. Leave the ice on for 20 minutes, 2-3 times a day. Remove the ice if your skin turns bright red. This is very important. If you cannot feel pain, heat, or cold, you have a greater risk of damage to the area. Brushing your teeth To keep your mouth and gums healthy, brush your teeth twice a day using a fluoride toothpaste. Use a toothpaste made for sensitive teeth as directed by your dental care provider, especially if the root is exposed. Always brush your teeth with a soft-bristled toothbrush. This will help prevent irritation to your gums. General instructions Floss at least once a day. Do not apply heat to the outside of the face. Gargle with a mixture of salt and water 3-4 times a day or as needed. To make salt water, completely dissolve -1 tsp (3-6 g) of salt in 1 cup (237 mL) of warm water. Keep all follow-up visits. This is important. Contact a dental care provider if: You have any unexplained dental pain. Your pain is not controlled with medicines. Your  symptoms get worse. You have new symptoms. Get help right away if: You are unable to open your mouth. You are having trouble breathing or swallowing. You have a fever. You notice that your face, neck, or jaw is swollen. These symptoms may represent a serious problem that is an emergency. Do not wait to see if the symptoms will go away. Get medical help right away. Call your local emergency services (911 in the [place] Do  not drive yourself to the hospital. Summary Dental pain may be caused by many things, including tooth decay and infection. Your pain may be mild or severe. Take over-the-counter and prescription medicines only as told by your dental care provider. Watch your dental pain for any changes. Let your dental care provider know if your symptoms get worse. This information is not intended to replace advice given to you by your health care provider. Make sure you discuss any questions you have with your health care provider. Document Revised: 03/27/2020 Document Reviewed: 03/27/2020 Elsevier Patient Education  2024 [employer].

## 2023-03-23 DIAGNOSIS — F3170 Bipolar disorder, currently in remission, most recent episode unspecified: Secondary | ICD-10-CM | POA: Insufficient documentation

## 2023-03-23 DIAGNOSIS — F411 Generalized anxiety disorder: Secondary | ICD-10-CM | POA: Insufficient documentation

## 2023-03-23 DIAGNOSIS — F1410 Cocaine abuse, uncomplicated: Secondary | ICD-10-CM | POA: Insufficient documentation

## 2023-04-12 ENCOUNTER — Ambulatory Visit: Payer: MEDICAID | Admitting: Physician Assistant

## 2023-04-12 ENCOUNTER — Encounter: Payer: Self-pay | Admitting: Physician Assistant

## 2023-04-12 VITALS — BP 100/61 | HR 77 | Ht 66.0 in | Wt 188.0 lb

## 2023-04-12 DIAGNOSIS — F3170 Bipolar disorder, currently in remission, most recent episode unspecified: Secondary | ICD-10-CM | POA: Diagnosis not present

## 2023-04-12 DIAGNOSIS — Z79899 Other long term (current) drug therapy: Secondary | ICD-10-CM

## 2023-04-12 DIAGNOSIS — K0889 Other specified disorders of teeth and supporting structures: Secondary | ICD-10-CM

## 2023-04-12 DIAGNOSIS — F1410 Cocaine abuse, uncomplicated: Secondary | ICD-10-CM

## 2023-04-12 DIAGNOSIS — K219 Gastro-esophageal reflux disease without esophagitis: Secondary | ICD-10-CM | POA: Diagnosis not present

## 2023-04-12 DIAGNOSIS — F411 Generalized anxiety disorder: Secondary | ICD-10-CM

## 2023-04-12 MED ORDER — OMEPRAZOLE 20 MG PO CPDR
20.0000 mg | DELAYED_RELEASE_CAPSULE | Freq: Two times a day (BID) | ORAL | 1 refills | Status: DC
Start: 2023-04-12 — End: 2023-04-13

## 2023-04-12 NOTE — Patient Instructions (Signed)
To help with your heartburn, you are going to increase your omeprazole to twice daily.  I do encourage you to also work on lifestyle modifications.  I started a referral to dentistry on your behalf.  We will call with today's lab results.  Roney Jaffe, PA-C Physician Assistant [facility] [email or web address]   Food Choices for Gastroesophageal Reflux Disease, Adult When you have gastroesophageal reflux disease (GERD), the foods you eat and your eating habits are very important. Choosing the right foods can help ease the discomfort of GERD. Consider working with a dietitian to help you make healthy food choices. What are tips for following this plan? Reading food labels Look for foods that are low in saturated fat. Foods that have less than 5% of daily value (DV) of fat and 0 g of trans fats may help with your symptoms. Cooking Cook foods using methods other than frying. This may include baking, steaming, grilling, or broiling. These are all methods that do not need a lot of fat for cooking. To add flavor, try to use herbs that are low in spice and acidity. Meal planning  Choose healthy foods that are low in fat, such as fruits, vegetables, whole grains, low-fat dairy products, lean meats, fish, and poultry. Eat frequent, small meals instead of three large meals each day. Eat your meals slowly, in a relaxed setting. Avoid bending over or lying down until 2-3 hours after eating. Limit high-fat foods such as fatty meats or fried foods. Limit your intake of fatty foods, such as oils, butter, and shortening. Avoid the following as told by your health care provider: Foods that cause symptoms. These may be different for different people. Keep a food diary to keep track of foods that cause symptoms. Alcohol. Drinking large amounts of liquid with meals. Eating meals during the 2-3 hours before bed. Lifestyle Maintain a healthy weight.  Ask your health care provider what weight is healthy for you. If you need to lose weight, work with your health care provider to do so safely. Exercise for at least 30 minutes on 5 or more days each week, or as told by your health care provider. Avoid wearing clothes that fit tightly around your waist and chest. Do not use any products that contain nicotine or tobacco. These products include cigarettes, chewing tobacco, and vaping devices, such as e-cigarettes. If you need help quitting, ask your health care provider. Sleep with the head of your bed raised. Use a wedge under the mattress or blocks under the bed frame to raise the head of the bed. Chew sugar-free gum after mealtimes. What foods should I eat?  Eat a healthy, well-balanced diet of fruits, vegetables, whole grains, low-fat dairy products, lean meats, fish, and poultry. Each person is different. Foods that may trigger symptoms in one person may not trigger any symptoms in another person. Work with your health care provider to identify foods that are safe for you. The items listed above may not be a complete list of recommended foods and beverages. Contact a dietitian for more information. What foods should I avoid? Limiting some of these foods may help manage the symptoms of GERD. Everyone is different. Consult a dietitian or your health care provider to help you identify the exact foods to avoid, if any. Fruits Any fruits prepared with added fat. Any fruits that cause symptoms. For some people this may include citrus fruits, such as oranges, grapefruit, pineapple, and lemons. Vegetables Deep-fried vegetables. [place] fries. Any vegetables prepared  with added fat. Any vegetables that cause symptoms. For some people, this may include tomatoes and tomato products, chili peppers, onions and garlic, and horseradish. Grains Pastries or quick breads with added fat. Meats and other proteins High-fat meats, such as fatty beef or pork, hot dogs,  ribs, ham, sausage, salami, and bacon. Fried meat or protein, including fried fish and fried chicken. Nuts and nut butters, in large amounts. Dairy Whole milk and chocolate milk. Sour cream. Cream. Ice cream. Cream cheese. Milkshakes. Fats and oils Butter. Margarine. Shortening. Ghee. Beverages Coffee and tea, with or without caffeine. Carbonated beverages. Sodas. Energy drinks. Fruit juice made with acidic fruits, such as orange or grapefruit. Tomato juice. Alcoholic drinks. Sweets and desserts Chocolate and cocoa. Donuts. Seasonings and condiments Pepper. Peppermint and spearmint. Added salt. Any condiments, herbs, or seasonings that cause symptoms. For some people, this may include curry, hot sauce, or vinegar-based salad dressings. The items listed above may not be a complete list of foods and beverages to avoid. Contact a dietitian for more information. Questions to ask your health care provider Diet and lifestyle changes are usually the first steps that are taken to manage symptoms of GERD. If diet and lifestyle changes do not improve your symptoms, talk with your health care provider about taking medicines. Where to find more information [employer]: aboutgerd.org Summary When you have gastroesophageal reflux disease (GERD), food and lifestyle choices may be very helpful in easing the discomfort of GERD. Eat frequent, small meals instead of three large meals each day. Eat your meals slowly, in a relaxed setting. Avoid bending over or lying down until 2-3 hours after eating. Limit high-fat foods such as fatty meats or fried foods. This information is not intended to replace advice given to you by your health care provider. Make sure you discuss any questions you have with your health care provider. Document Revised: 01/01/2020 Document Reviewed: 01/01/2020 Elsevier Patient Education  2024 [employer].

## 2023-04-12 NOTE — Progress Notes (Unsigned)
   Established Patient Office Visit  Subjective   Patient ID: [name] [name], female    DOB: 13-Jun-1981  Age: 42 y.o. MRN: [id number]  Chief Complaint  Patient presents with   Medication Problem    Omeprazole stop working around dinner, patient is experiencing heartburn after dinner and is needing to use tums for relief.     [facility] on the 15th Regular physical on the 11th   Heartburn before dinner -  Use to take 20 mg BID  Acid reflux      Past Medical History:  Diagnosis Date   Cocaine abuse in remission (HCC)    No pertinent past medical history    Social History   Socioeconomic History   Marital status: Single    Spouse name: Not on file   Number of children: Not on file   Years of education: Not on file   Highest education level: Not on file  Occupational History   Not on file  Tobacco Use   Smoking status: Former    Current packs/day: 0.25    Types: Cigarettes   Smokeless tobacco: Never  Substance and Sexual Activity   Alcohol use: Yes    Comment: weekly   Drug use: Yes    Types: Cocaine, Marijuana   Sexual activity: Never    Birth control/protection: I.U.D.  Other Topics Concern   Not on file  Social History Narrative   ** Merged History Encounter **       Social Determinants of Health   Financial Resource Strain: Not on file  Food Insecurity: Not on file  Transportation Needs: Not on file  Physical Activity: Not on file  Stress: Not on file  Social Connections: Not on file  Intimate Partner Violence: Not on file   Family History  Problem Relation Age of Onset   Anesthesia problems Neg Hx    Allergies  Allergen Reactions   Risperidone Other (See Comments)    Heart flutters, feels faint   Risperidone And Related     Heart flutters, feels faint    ROS    Objective:     Ht 5\' 6"  (1.676 m)   Wt 188 lb (85.3 kg)   LMP 03/18/2023 (Exact Date)   BMI 30.34 kg/m  BP Readings from Last 3 Encounters:  03/22/23 115/60   03/16/23 110/74  10/15/20 (!) 122/102   Wt Readings from Last 3 Encounters:  04/12/23 188 lb (85.3 kg)  03/22/23 174 lb (78.9 kg)  03/16/23 170 lb (77.1 kg)      Physical Exam     Assessment & Plan:   Problem List Items Addressed This Visit       Other   Cocaine abuse (HCC)   Bipolar affective disorder in remission (HCC)   GAD (generalized anxiety disorder)   Relevant Medications   mirtazapine (REMERON) 15 MG tablet   Other Visit Diagnoses     Lithium use    -  Primary   Gastroesophageal reflux disease without esophagitis           No follow-ups on file.    Kasandra Knudsen Mayers, PA-C

## 2023-04-13 DIAGNOSIS — K219 Gastro-esophageal reflux disease without esophagitis: Secondary | ICD-10-CM | POA: Insufficient documentation

## 2023-04-13 LAB — LITHIUM LEVEL: Lithium Lvl: 0.6 mmol/L (ref 0.5–1.2)

## 2023-04-13 MED ORDER — OMEPRAZOLE 20 MG PO CPDR
20.0000 mg | DELAYED_RELEASE_CAPSULE | Freq: Two times a day (BID) | ORAL | 1 refills | Status: DC
Start: 2023-04-13 — End: 2023-04-14

## 2023-04-13 NOTE — Addendum Note (Signed)
Addended by: Roney Jaffe on: 04/13/2023 05:43 PM   Modules accepted: Orders

## 2023-04-14 MED ORDER — OMEPRAZOLE 20 MG PO CPDR: 20.0000 mg | DELAYED_RELEASE_CAPSULE | Freq: Two times a day (BID) | ORAL | 1 refills | Status: AC

## 2023-04-14 NOTE — Addendum Note (Signed)
Addended by: Roney Jaffe on: 04/14/2023 01:05 PM   Modules accepted: Orders
